# Patient Record
Sex: Female | Born: 1970 | Race: White | Hispanic: No | Marital: Married | Smoking: Never smoker
Health system: Southern US, Community
[De-identification: ages and names within clinical notes are randomized; demographics above are authoritative.]

## PROBLEM LIST (undated history)

## (undated) DIAGNOSIS — E039 Hypothyroidism, unspecified: Secondary | ICD-10-CM

## (undated) DIAGNOSIS — C50919 Malignant neoplasm of unspecified site of unspecified female breast: Secondary | ICD-10-CM

## (undated) HISTORY — PX: MOHS SURGERY: SUR867

## (undated) HISTORY — PX: REDUCTION MAMMAPLASTY: SUR839

---
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
## 1999-11-26 ENCOUNTER — Other Ambulatory Visit: Admission: RE | Admit: 1999-11-26 | Discharge: 1999-11-26 | Payer: Self-pay | Admitting: Emergency Medicine

## 2000-10-04 ENCOUNTER — Encounter: Payer: Self-pay | Admitting: Obstetrics & Gynecology

## 2000-10-04 ENCOUNTER — Ambulatory Visit (HOSPITAL_COMMUNITY): Admission: RE | Admit: 2000-10-04 | Discharge: 2000-10-04 | Payer: Self-pay | Admitting: Obstetrics & Gynecology

## 2000-10-06 ENCOUNTER — Inpatient Hospital Stay (HOSPITAL_COMMUNITY): Admission: AD | Admit: 2000-10-06 | Discharge: 2000-10-09 | Payer: Self-pay | Admitting: Obstetrics & Gynecology

## 2001-02-14 ENCOUNTER — Other Ambulatory Visit: Admission: RE | Admit: 2001-02-14 | Discharge: 2001-02-14 | Payer: Self-pay | Admitting: Obstetrics & Gynecology

## 2002-04-30 ENCOUNTER — Other Ambulatory Visit: Admission: RE | Admit: 2002-04-30 | Discharge: 2002-04-30 | Payer: Self-pay | Admitting: Obstetrics & Gynecology

## 2003-08-08 ENCOUNTER — Other Ambulatory Visit: Admission: RE | Admit: 2003-08-08 | Discharge: 2003-08-08 | Payer: Self-pay | Admitting: Obstetrics & Gynecology

## 2004-08-25 ENCOUNTER — Other Ambulatory Visit: Admission: RE | Admit: 2004-08-25 | Discharge: 2004-08-25 | Payer: Self-pay | Admitting: Obstetrics & Gynecology

## 2004-08-30 ENCOUNTER — Encounter: Admission: RE | Admit: 2004-08-30 | Discharge: 2004-08-30 | Payer: Self-pay | Admitting: Obstetrics & Gynecology

## 2011-05-18 ENCOUNTER — Emergency Department (HOSPITAL_BASED_OUTPATIENT_CLINIC_OR_DEPARTMENT_OTHER)
Admission: EM | Admit: 2011-05-18 | Discharge: 2011-05-18 | Disposition: A | Discharge status: Home / Self Care | Payer: No Typology Code available for payment source | Attending: Emergency Medicine | Admitting: Emergency Medicine

## 2011-05-18 ENCOUNTER — Emergency Department (INDEPENDENT_AMBULATORY_CARE_PROVIDER_SITE_OTHER): Payer: No Typology Code available for payment source

## 2011-05-18 ENCOUNTER — Encounter: Payer: Self-pay | Admitting: Student

## 2011-05-18 DIAGNOSIS — R1032 Left lower quadrant pain: Secondary | ICD-10-CM | POA: Insufficient documentation

## 2011-05-18 DIAGNOSIS — M542 Cervicalgia: Secondary | ICD-10-CM | POA: Insufficient documentation

## 2011-05-18 DIAGNOSIS — R110 Nausea: Secondary | ICD-10-CM | POA: Insufficient documentation

## 2011-05-18 DIAGNOSIS — R0789 Other chest pain: Secondary | ICD-10-CM

## 2011-05-18 DIAGNOSIS — Y92410 Unspecified street and highway as the place of occurrence of the external cause: Secondary | ICD-10-CM | POA: Insufficient documentation

## 2011-05-18 DIAGNOSIS — E039 Hypothyroidism, unspecified: Secondary | ICD-10-CM | POA: Insufficient documentation

## 2011-05-18 HISTORY — DX: Hypothyroidism, unspecified: E03.9

## 2011-05-18 MED ORDER — IBUPROFEN 600 MG PO TABS: 600.0000 mg | ORAL_TABLET | Freq: Four times a day (QID) | ORAL | Status: AC | PRN

## 2011-05-18 MED ORDER — HYDROCODONE-ACETAMINOPHEN 5-500 MG PO TABS: 1.0000 | ORAL_TABLET | Freq: Four times a day (QID) | ORAL | Status: AC | PRN

## 2011-05-18 MED ORDER — ONDANSETRON 4 MG PO TBDP
4.0000 mg | ORAL_TABLET | Freq: Once | ORAL | Status: AC
  Administered 2011-05-18: 4 mg via ORAL
  Filled 2011-05-18: qty 1

## 2011-05-18 NOTE — ED Notes (Signed)
MD at bedside to clear patient from [facility]
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
## 2015-09-19 ENCOUNTER — Emergency Department (HOSPITAL_COMMUNITY)
Admission: EM | Admit: 2015-09-19 | Discharge: 2015-09-19 | Disposition: A | Discharge status: Home / Self Care | Payer: BLUE CROSS/BLUE SHIELD | Attending: Emergency Medicine | Admitting: Emergency Medicine

## 2015-09-19 ENCOUNTER — Encounter (HOSPITAL_COMMUNITY): Payer: Self-pay | Admitting: Emergency Medicine

## 2015-09-19 DIAGNOSIS — Y9389 Activity, other specified: Secondary | ICD-10-CM | POA: Diagnosis not present

## 2015-09-19 DIAGNOSIS — S61213A Laceration without foreign body of left middle finger without damage to nail, initial encounter: Secondary | ICD-10-CM | POA: Diagnosis not present

## 2015-09-19 DIAGNOSIS — Y9289 Other specified places as the place of occurrence of the external cause: Secondary | ICD-10-CM | POA: Diagnosis not present

## 2015-09-19 DIAGNOSIS — W271XXA Contact with garden tool, initial encounter: Secondary | ICD-10-CM | POA: Insufficient documentation

## 2015-09-19 DIAGNOSIS — Z79899 Other long term (current) drug therapy: Secondary | ICD-10-CM | POA: Diagnosis not present

## 2015-09-19 DIAGNOSIS — Y998 Other external cause status: Secondary | ICD-10-CM | POA: Insufficient documentation

## 2015-09-19 DIAGNOSIS — S6992XA Unspecified injury of left wrist, hand and finger(s), initial encounter: Secondary | ICD-10-CM | POA: Diagnosis present

## 2015-09-19 DIAGNOSIS — E039 Hypothyroidism, unspecified: Secondary | ICD-10-CM | POA: Diagnosis not present

## 2015-09-19 MED ORDER — ACETAMINOPHEN-CODEINE #3 300-30 MG PO TABS: 1.0000 | ORAL_TABLET | Freq: Four times a day (QID) | ORAL | Status: DC | PRN

## 2015-09-19 MED ORDER — LIDOCAINE HCL 2 % IJ SOLN
10.0000 mL | Freq: Once | INTRAMUSCULAR | Status: AC
  Administered 2015-09-19: 200 mg via INTRADERMAL
  Filled 2015-09-19: qty 20

## 2015-09-19 NOTE — Discharge Instructions (Signed)
Please have your sutures removed in 5-7 days.  Take tylenol #3 as needed for pain.  Return to the ER if you notice signs of infection.    Laceration Care, Adult A laceration is a cut that goes through all of the layers of the skin and into the tissue that is right under the skin. Some lacerations heal on their own. Others need to be closed with stitches (sutures), staples, skin adhesive strips, or skin glue. Proper laceration care minimizes the risk of infection and helps the laceration to heal better. HOW TO CARE FOR YOUR LACERATION If sutures or staples were used:  Keep the wound clean and dry.  If you were given a bandage (dressing), you should change it at least one time per day or as told by your health care provider. You should also change it if it becomes wet or dirty.  Keep the wound completely dry for the first 24 hours or as told by your health care provider. After that time, you may shower or bathe. However, make sure that the wound is not soaked in water until after the sutures or staples have been removed.  Clean the wound one time each day or as told by your health care provider:  Wash the wound with soap and water.  Rinse the wound with water to remove all soap.  Pat the wound dry with a clean towel. Do not rub the wound.  After cleaning the wound, apply a thin layer of antibiotic ointmentas told by your health care provider. This will help to prevent infection and keep the dressing from sticking to the wound.  Have the sutures or staples removed as told by your health care provider. If skin adhesive strips were used:  Keep the wound clean and dry.  If you were given a bandage (dressing), you should change it at least one time per day or as told by your health care provider. You should also change it if it becomes dirty or wet.  Do not get the skin adhesive strips wet. You may shower or bathe, but be careful to keep the wound dry.  If the wound gets wet, pat it dry  with a clean towel. Do not rub the wound.  Skin adhesive strips fall off on their own. You may trim the strips as the wound heals. Do not remove skin adhesive strips that are still stuck to the wound. They will fall off in time. If skin glue was used:  Try to keep the wound dry, but you may briefly wet it in the shower or bath. Do not soak the wound in water, such as by swimming.  After you have showered or bathed, gently pat the wound dry with a clean towel. Do not rub the wound.  Do not do any activities that will make you sweat heavily until the skin glue has fallen off on its own.  Do not apply liquid, cream, or ointment medicine to the wound while the skin glue is in place. Using those may loosen the film before the wound has healed.  If you were given a bandage (dressing), you should change it at least one time per day or as told by your health care provider. You should also change it if it becomes dirty or wet.  If a dressing is placed over the wound, be careful not to apply tape directly over the skin glue. Doing that may cause the glue to be pulled off before the wound has healed.  Do not pick at the glue. The skin glue usually remains in place for 5-10 days, then it falls off of the skin. General Instructions  Take over-the-counter and prescription medicines only as told by your health care provider.  If you were prescribed an antibiotic medicine or ointment, take or apply it as told by your doctor. Do not stop using it even if your condition improves.  To help prevent scarring, make sure to cover your wound with sunscreen whenever you are outside after stitches are removed, after adhesive strips are removed, or when glue remains in place and the wound is healed. Make sure to wear a sunscreen of at least 30 SPF.  Do not scratch or pick at the wound.  Keep all follow-up visits as told by your health care provider. This is important.  Check your wound every day for signs of  infection. Watch for:  Redness, swelling, or pain.  Fluid, blood, or pus.  Raise (elevate) the injured area above the level of your heart while you are sitting or lying down, if possible. SEEK MEDICAL CARE IF:  You received a tetanus shot and you have swelling, severe pain, redness, or bleeding at the injection site.  You have a fever.  A wound that was closed breaks open.  You notice a bad smell coming from your wound or your dressing.  You notice something coming out of the wound, such as wood or glass.  Your pain is not controlled with medicine.  You have increased redness, swelling, or pain at the site of your wound.  You have fluid, blood, or pus coming from your wound.  You notice a change in the color of your skin near your wound.  You need to change the dressing frequently due to fluid, blood, or pus draining from the wound.  You develop a new rash.  You develop numbness around the wound. SEEK IMMEDIATE MEDICAL CARE IF:  You develop severe swelling around the wound.  Your pain suddenly increases and is severe.  You develop painful lumps near the wound or on skin that is anywhere on your body.  You have a red streak going away from your wound.  The wound is on your hand or foot and you cannot properly move a finger or toe.  The wound is on your hand or foot and you notice that your fingers or toes look pale or bluish.   This information is not intended to replace advice given to you by your health care provider. Make sure you discuss any questions you have with your health care provider.   Document Released: 06/06/2005 Document Revised: 10/21/2014 Document Reviewed: 06/02/2014 Elsevier Interactive Patient Education [employer].

## 2015-09-19 NOTE — ED Provider Notes (Signed)
CSN: [place]:[id number]     Arrival date & time 09/19/15  1402 History   First MD Initiated Contact with Patient 09/19/15 1416     Chief Complaint  Patient presents with  . Laceration     (Consider location/radiation/quality/duration/timing/severity/associated sxs/prior Treatment) HPI   45 year old female who presents for evaluation of finger laceration. Patient reported approximately 30 minutes ago she was using a hedge clipper while trimming branches when she forgetfully reaching her L hand across to remove a branch and accidentally injured her left middle finger. She suffered to small lacerations to the pad of her L middle finger.  She report 4/10 non radiating throbbing pain to the pad of finger without any numbness. She did tried rinsing it under the water for several minutes and came here.  She is UTD with tetanus.  She is R hand dominant.  She denies any other injury.    Past Medical History  Diagnosis Date  . Hypothyroidism    Past Surgical History  Procedure Laterality Date  . Cesarean section     No family history on file. Social History  Substance Use Topics  . Smoking status: Never Smoker   . Smokeless tobacco: Not on file  . Alcohol Use: Yes   OB History    No data available     Review of Systems  Constitutional: Negative for fever.  Skin: Positive for wound.  Neurological: Negative for numbness.      Allergies  Phenobarbital  Home Medications   Prior to Admission medications   Medication Sig Start Date End Date Taking? Authorizing Provider  levothyroxine (SYNTHROID, LEVOTHROID) 75 MCG tablet Take 75 mcg by mouth daily.      Historical Provider, MD   There were no vitals taken for this visit. Physical Exam  Constitutional: She appears well-developed and well-nourished. No distress.  HENT:  Head: Atraumatic.  Eyes: Conjunctivae are normal.  Neck: Neck supple.  Musculoskeletal: She exhibits tenderness (L hand, middle finger: 1cm oblique superficial lac to  pad of finger. 56mm superficial lac medial to the other lac on the pad of finger.  brisk cap  refill.  no nail involvement.  no joint involvement).  Neurological: She is alert.  Skin: No rash noted.  Psychiatric: She has a normal mood and affect.  Nursing note and vitals reviewed.   ED Course  Procedures (including critical care time)   MDM   Final diagnoses:  Laceration of left middle finger w/o foreign body w/o damage to nail, initial encounter    BP 108/74 mmHg  Pulse 94  Temp(Src) 98 F (36.7 C) (Oral)  Ht 5\' 3"  (1.6 m)  Wt 58.968 kg  BMI 23.03 kg/m2  SpO2 100%   2:26 PM Pt accidentally injured her L middle finger on a hedge clipper PTA.  Will perform wound repair.    LACERATION REPAIR Performed by: Domenic Moras Authorized byDomenic Moras Consent: Verbal consent obtained. Risks and benefits: risks, benefits and alternatives were discussed Consent given by: patient Patient identity confirmed: provided demographic data Prepped and Draped in normal sterile fashion Wound explored  Laceration Location: L middle finger, pad  Laceration Length: 1cm  No Foreign Bodies seen or palpated  Anesthesia: digital nerve block  Local anesthetic: lidocaine 2% w/o epinephrine  Anesthetic total: 4 ml  Irrigation method: syringe Amount of cleaning: standard  Skin closure: prolene 5.0  Number of sutures: 4  Technique: simple interrupted  Patient tolerance: Patient tolerated the procedure well with no immediate complications.   Domenic Moras, PA-C  09/19/15 [place], MD 09/20/15 [id number]

## 2015-09-19 NOTE — ED Notes (Signed)
Pt states she cut her left third finger on hedge clippers about 30 mins ago

## 2015-11-04 ENCOUNTER — Other Ambulatory Visit: Payer: Self-pay | Admitting: Obstetrics & Gynecology

## 2015-11-04 DIAGNOSIS — R928 Other abnormal and inconclusive findings on diagnostic imaging of breast: Secondary | ICD-10-CM

## 2015-11-10 ENCOUNTER — Ambulatory Visit
Admission: RE | Admit: 2015-11-10 | Discharge: 2015-11-10 | Disposition: A | Discharge status: Home / Self Care | Payer: BLUE CROSS/BLUE SHIELD | Source: Ambulatory Visit | Attending: Obstetrics & Gynecology | Admitting: Obstetrics & Gynecology

## 2015-11-10 DIAGNOSIS — R928 Other abnormal and inconclusive findings on diagnostic imaging of breast: Secondary | ICD-10-CM

## 2019-06-21 DIAGNOSIS — C50919 Malignant neoplasm of unspecified site of unspecified female breast: Secondary | ICD-10-CM

## 2019-06-21 HISTORY — DX: Malignant neoplasm of unspecified site of unspecified female breast: C50.919

## 2020-01-23 ENCOUNTER — Other Ambulatory Visit: Payer: Self-pay | Admitting: Obstetrics & Gynecology

## 2020-01-23 DIAGNOSIS — R928 Other abnormal and inconclusive findings on diagnostic imaging of breast: Secondary | ICD-10-CM

## 2020-02-03 ENCOUNTER — Other Ambulatory Visit: Payer: Self-pay | Admitting: Obstetrics & Gynecology

## 2020-02-03 ENCOUNTER — Other Ambulatory Visit: Payer: Self-pay

## 2020-02-03 ENCOUNTER — Ambulatory Visit
Admission: RE | Admit: 2020-02-03 | Discharge: 2020-02-03 | Disposition: A | Discharge status: Home / Self Care | Payer: BLUE CROSS/BLUE SHIELD | Source: Ambulatory Visit | Attending: Obstetrics & Gynecology | Admitting: Obstetrics & Gynecology

## 2020-02-03 DIAGNOSIS — R928 Other abnormal and inconclusive findings on diagnostic imaging of breast: Secondary | ICD-10-CM

## 2020-02-03 DIAGNOSIS — R921 Mammographic calcification found on diagnostic imaging of breast: Secondary | ICD-10-CM

## 2020-02-11 ENCOUNTER — Ambulatory Visit
Admission: RE | Admit: 2020-02-11 | Discharge: 2020-02-11 | Disposition: A | Discharge status: Home / Self Care | Payer: BC Managed Care – PPO | Source: Ambulatory Visit | Attending: Obstetrics & Gynecology | Admitting: Obstetrics & Gynecology

## 2020-02-11 ENCOUNTER — Other Ambulatory Visit: Payer: Self-pay

## 2020-02-11 DIAGNOSIS — R921 Mammographic calcification found on diagnostic imaging of breast: Secondary | ICD-10-CM

## 2020-02-14 ENCOUNTER — Encounter: Payer: Self-pay | Admitting: *Deleted

## 2020-02-14 DIAGNOSIS — D0511 Intraductal carcinoma in situ of right breast: Secondary | ICD-10-CM | POA: Insufficient documentation

## 2020-02-18 ENCOUNTER — Other Ambulatory Visit: Payer: BC Managed Care – PPO | Admitting: Licensed Clinical Social Worker

## 2020-02-18 NOTE — Progress Notes (Signed)
[facility] NOTE  Patient Care Team: [name], Shyrl Numbers, MD as PCP - General (Family Medicine) Rockwell Germany, RN as Oncology Nurse Navigator Mauro Kaufmann, RN as Oncology Nurse Navigator Alphonsa Overall, MD as Consulting Physician (General Surgery) Nicholas Lose, MD as Consulting Physician (Hematology and Oncology)  CHIEF COMPLAINTS/PURPOSE OF CONSULTATION:  Newly diagnosed breast cancer  HISTORY OF PRESENTING ILLNESS:  Meredith [name] 49 y.o. 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 Screening mammogram detected right breast calcifications. Diagnostic mammogram on 02/03/20 showed the calcifications spanning 1.4cm. Biopsy on 02/11/20 showed ductal carcinoma in situ, high grade, ER+ 95%, PR+ 30%. She presents to the clinic today for initial evaluation and discussion of treatment options.   I reviewed her records extensively and collaborated the history with the patient.  SUMMARY OF ONCOLOGIC HISTORY: Oncology History  Ductal carcinoma in situ (DCIS) of right breast  02/14/2020 Initial Diagnosis   Screening mammogram detected right breast calcifications spanning 1.4cm. Biopsy showed DCIS, high grade, ER+ 95%, PR+ 30%.     MEDICAL HISTORY:  Past Medical History:  Diagnosis Date  . Breast cancer ([place])   . Hypothyroidism     SURGICAL HISTORY: Past Surgical History:  Procedure Laterality Date  . CESAREAN SECTION    . MOHS SURGERY     basal cell    SOCIAL HISTORY: Social History   Socioeconomic History  . Marital status: Married    Spouse name: Not on file  . Number of children: Not on file  . Years of education: Not on file  . Highest education level: Not on file  Occupational History  . Not on file  Tobacco Use  . Smoking status: Never Smoker  . Smokeless tobacco: Never Used  Substance and Sexual Activity  . Alcohol use: Yes  . Drug use: Never  . Sexual activity: Not on file  Other Topics  Concern  . Not on file  Social History Narrative  . Not on file   Social Determinants of Health   Financial Resource Strain: Low Risk   . Difficulty of Paying Living Expenses: Not hard at all  Food Insecurity: No Food Insecurity  . Worried About Charity fundraiser in the Last Year: Never true  . Ran Out of Food in the Last Year: Never true  Transportation Needs: No Transportation Needs  . Lack of Transportation (Medical): No  . Lack of Transportation (Non-Medical): No  Physical Activity:   . Days of Exercise per Week: Not on file  . Minutes of Exercise per Session: Not on file  Stress:   . Feeling of Stress : Not on file  Social Connections:   . Frequency of Communication with Friends and Family: Not on file  . Frequency of Social Gatherings with Friends and Family: Not on file  . Attends Religious Services: Not on file  . Active Member of Clubs or Organizations: Not on file  . Attends Archivist Meetings: Not on file  . Marital Status: Not on file  Intimate Partner Violence:   . Fear of Current or Ex-Partner: Not on file  . Emotionally Abused: Not on file  . Physically Abused: Not on file  . Sexually Abused: Not on file    FAMILY HISTORY: Family History  Problem Relation Age of Onset  . Prostate cancer Maternal Grandfather   . Skin cancer Maternal Grandfather     ALLERGIES:  is allergic to demerol [meperidine] and  phenobarbital.  MEDICATIONS:  Current Outpatient Medications  Medication Sig Dispense Refill  . acetaminophen-codeine (TYLENOL #3) 300-30 MG tablet Take 1-2 tablets by mouth every 6 (six) hours as needed for moderate pain. 15 tablet 0  . cetirizine (ZYRTEC) 10 MG tablet Take by mouth.    . levonorgestrel (MIRENA, 52 MG,) 20 MCG/24HR IUD Mirena 20 mcg/24 hours (6 yrs) 52 mg intrauterine device    . levothyroxine (SYNTHROID) 100 MCG tablet Take 100 mcg by mouth daily before breakfast.     No current facility-administered medications for this  visit.    REVIEW OF SYSTEMS:     All other systems were reviewed with the patient and are negative.  PHYSICAL EXAMINATION: ECOG PERFORMANCE STATUS: 1 - Symptomatic but completely ambulatory  Vitals:   02/19/20 0844  BP: 120/86  Pulse: 82  Resp: 17  Temp: 98.1 F (36.7 C)  SpO2: 100%   Filed Weights   02/19/20 0844  Weight: 152 lb (68.9 kg)      LABORATORY DATA:  I have reviewed the data as listed Lab Results  Component Value Date   WBC 4.4 02/19/2020   HGB 14.0 02/19/2020   HCT 41.3 02/19/2020   MCV 89.2 02/19/2020   PLT 263 02/19/2020   Lab Results  Component Value Date   NA 140 02/19/2020   K 4.6 02/19/2020   CL 105 02/19/2020   CO2 28 02/19/2020    RADIOGRAPHIC STUDIES: I have personally reviewed the radiological reports and agreed with the findings in the report.  ASSESSMENT AND PLAN:  Ductal carcinoma in situ (DCIS) of right breast 02/14/2020:Screening mammogram detected right breast calcifications spanning 1.4cm. Biopsy showed DCIS, high grade, ER+ 95%, PR+ 30%.  Tis NX stage 0  Pathology review: I discussed with the patient the difference between DCIS and invasive breast cancer. It is considered a precancerous lesion. DCIS is classified as a 0. It is generally detected through mammograms as calcifications. We discussed the significance of grades and its impact on prognosis. We also discussed the importance of ER and PR receptors and their implications to adjuvant treatment options. Prognosis of DCIS dependence on grade, comedo necrosis. It is anticipated that if not treated, 20-30% of DCIS can develop into invasive breast cancer.  Recommendation: 1. Breast conserving surgery 2. Followed by adjuvant radiation therapy 3. Followed by antiestrogen therapy with tamoxifen 5 years versus anastrozole (we discussed both of these options and she will decide when she is ready to start antiestrogen therapy)  Tamoxifen counseling: We discussed the risks and benefits  of tamoxifen. These include but not limited to insomnia, hot flashes, mood changes, vaginal dryness, and weight gain. Although rare, serious side effects including endometrial cancer, risk of blood clots were also discussed. We strongly believe that the benefits far outweigh the risks. Patient understands these risks and consented to starting treatment. Planned treatment duration is 5 years.  Return to clinic after surgery to discuss the final pathology report and come up with an adjuvant treatment plan.     All questions were answered. The patient knows to call the clinic with any problems, questions or concerns.   Rulon Eisenmenger, MD, MPH 02/19/2020    I, Molly Dorshimer, am acting as scribe for Nicholas Lose, MD.  I have reviewed the above documentation for accuracy and completeness, and I agree with the above.

## 2020-02-19 ENCOUNTER — Inpatient Hospital Stay: Payer: BC Managed Care – PPO | Attending: Hematology and Oncology | Admitting: Hematology and Oncology

## 2020-02-19 ENCOUNTER — Ambulatory Visit
Admission: RE | Admit: 2020-02-19 | Discharge: 2020-02-19 | Disposition: A | Discharge status: Home / Self Care | Payer: BC Managed Care – PPO | Source: Ambulatory Visit | Attending: Radiation Oncology | Admitting: Radiation Oncology

## 2020-02-19 ENCOUNTER — Other Ambulatory Visit: Payer: Self-pay | Admitting: *Deleted

## 2020-02-19 ENCOUNTER — Encounter: Payer: Self-pay | Admitting: *Deleted

## 2020-02-19 ENCOUNTER — Inpatient Hospital Stay (HOSPITAL_BASED_OUTPATIENT_CLINIC_OR_DEPARTMENT_OTHER): Payer: BC Managed Care – PPO | Admitting: Internal Medicine

## 2020-02-19 ENCOUNTER — Inpatient Hospital Stay: Payer: BC Managed Care – PPO | Admitting: Licensed Clinical Social Worker

## 2020-02-19 ENCOUNTER — Other Ambulatory Visit: Payer: Self-pay

## 2020-02-19 ENCOUNTER — Encounter: Payer: Self-pay | Admitting: Hematology and Oncology

## 2020-02-19 ENCOUNTER — Ambulatory Visit (HOSPITAL_BASED_OUTPATIENT_CLINIC_OR_DEPARTMENT_OTHER): Payer: BC Managed Care – PPO | Admitting: Genetic Counselor

## 2020-02-19 ENCOUNTER — Ambulatory Visit: Payer: BC Managed Care – PPO | Admitting: Physical Therapy

## 2020-02-19 DIAGNOSIS — E039 Hypothyroidism, unspecified: Secondary | ICD-10-CM | POA: Diagnosis not present

## 2020-02-19 DIAGNOSIS — D0511 Intraductal carcinoma in situ of right breast: Secondary | ICD-10-CM

## 2020-02-19 DIAGNOSIS — Z170 Estrogen receptor positive status [ER+]: Secondary | ICD-10-CM | POA: Diagnosis not present

## 2020-02-19 DIAGNOSIS — Z8042 Family history of malignant neoplasm of prostate: Secondary | ICD-10-CM

## 2020-02-19 LAB — CMP (CANCER CENTER ONLY)
ALT: 16 U/L (ref 0–44)
AST: 22 U/L (ref 15–41)
Albumin: 4.4 g/dL (ref 3.5–5.0)
Alkaline Phosphatase: 74 U/L (ref 38–126)
Anion gap: 7 (ref 5–15)
BUN: 14 mg/dL (ref 6–20)
CO2: 28 mmol/L (ref 22–32)
Calcium: 10.5 mg/dL — ABNORMAL HIGH (ref 8.9–10.3)
Chloride: 105 mmol/L (ref 98–111)
Creatinine: 0.83 mg/dL (ref 0.44–1.00)
GFR, Est AFR Am: >60 mL/min (ref >60)
GFR, Estimated: >60 mL/min (ref >60)
Glucose, Bld: 87 mg/dL (ref 70–99)
Potassium: 4.6 mmol/L (ref 3.5–5.1)
Sodium: 140 mmol/L (ref 135–145)
Total Bilirubin: 0.7 mg/dL (ref 0.3–1.2)
Total Protein: 7.5 g/dL (ref 6.5–8.1)

## 2020-02-19 LAB — CBC WITH DIFFERENTIAL (CANCER CENTER ONLY)
Abs Immature Granulocytes: 0.01 10*3/uL (ref 0.00–0.07)
Basophils Absolute: 0.1 10*3/uL (ref 0.0–0.1)
Basophils Relative: 2 %
Eosinophils Absolute: 0.1 10*3/uL (ref 0.0–0.5)
Eosinophils Relative: 3 %
HCT: 41.3 % (ref 36.0–46.0)
Hemoglobin: 14 g/dL (ref 12.0–15.0)
Immature Granulocytes: 0 %
Lymphocytes Relative: 33 %
Lymphs Abs: 1.5 10*3/uL (ref 0.7–4.0)
MCH: 30.2 pg (ref 26.0–34.0)
MCHC: 33.9 g/dL (ref 30.0–36.0)
MCV: 89.2 fL (ref 80.0–100.0)
Monocytes Absolute: 0.5 10*3/uL (ref 0.1–1.0)
Monocytes Relative: 12 %
Neutro Abs: 2.2 10*3/uL (ref 1.7–7.7)
Neutrophils Relative %: 50 %
Platelet Count: 263 10*3/uL (ref 150–400)
RBC: 4.63 MIL/uL (ref 3.87–5.11)
RDW: 11.5 % (ref 11.5–15.5)
WBC Count: 4.4 10*3/uL (ref 4.0–10.5)
nRBC: 0 % (ref 0.0–0.2)

## 2020-02-19 LAB — GENETIC SCREENING ORDER

## 2020-02-19 NOTE — Assessment & Plan Note (Signed)
02/14/2020:Screening mammogram detected right breast calcifications spanning 1.4cm. Biopsy showed DCIS, high grade, ER+ 95%, PR+ 30%.  Tis NX stage 0  Pathology review: I discussed with the patient the difference between DCIS and invasive breast cancer. It is considered a precancerous lesion. DCIS is classified as a 0. It is generally detected through mammograms as calcifications. We discussed the significance of grades and its impact on prognosis. We also discussed the importance of ER and PR receptors and their implications to adjuvant treatment options. Prognosis of DCIS dependence on grade, comedo necrosis. It is anticipated that if not treated, 20-30% of DCIS can develop into invasive breast cancer.  Recommendation: 1. Breast conserving surgery 2. Followed by adjuvant radiation therapy 3. Followed by antiestrogen therapy with tamoxifen 5 years versus anastrozole (we discussed both of these options and she will decide when she is ready to start antiestrogen therapy)  Tamoxifen counseling: We discussed the risks and benefits of tamoxifen. These include but not limited to insomnia, hot flashes, mood changes, vaginal dryness, and weight gain. Although rare, serious side effects including endometrial cancer, risk of blood clots were also discussed. We strongly believe that the benefits far outweigh the risks. Patient understands these risks and consented to starting treatment. Planned treatment duration is 5 years.  Return to clinic after surgery to discuss the final pathology report and come up with an adjuvant treatment plan.

## 2020-02-19 NOTE — Progress Notes (Signed)
[place] [facility] INITIAL SDOH Screening Note   SEQUITA WISE is a 49 y.o. year old female accompanied by husband, [name], during [facility].    Ms. Darrington was given information about support services today including CSW contact information, information about support team members and programs.   SDOH (Social Determinants of Health) assessments performed: Yes- no needs identified    Family/Social Information:  . Housing Arrangement: patient lives with husband, [name] . Family members/support persons in your life? Husband, two sons (ages 31 & 32), daughter-in-law, mom, other family, friends . Transportation: no concerns . Financial concerns: No  . Employment: Working full time as Cabin crew Income source: Employment and husband's employment . Food Security: no concerns . Medication Concerns: no (if patient is experiencing medication concerns, please refer to pharmacy) . Services Currently in place:  n/a  . Concerns about diagnosis and/or treatment: some stress about if MRI will pick up anything else, but overall feels good about treatment plan and thankful it was caught early . Patient reported stressors: Adjusting to my illness . Patient enjoys time with friends & family (sons, baby grandchild), being active, her job Current coping skills/ strengths: Average or above average intelligence Capable of independent living Occupational psychologist fund of knowledge Motivation for treatment/growth Supportive family/friends     SUMMARY: Current SDOH Barriers:  . none identified today  Clinical Social Work Clinical Goal(s):  Marland Kitchen Over the next 30 days, patient will attend all scheduled medical appointments:    Interventions: . Patient interviewed and SDOH assessment performed . Provided patient with information about support programs and services   Follow Up Plan: Client will continue to utilize support system and attend all scheduled appointments Patient  verbalizes understanding of plan: Yes   Edwinna Areola Toneka Fullen LCSW

## 2020-02-19 NOTE — Progress Notes (Signed)
Radiation Oncology         [phone] ________________________________  Name: Meredith [name]        MRN: [id number]  Date of Service: 02/19/2020 DOB: [birth year]  OX:[id number], Shyrl Numbers, MD  [name], Shyrl Numbers, *     REFERRING PHYSICIAN: Wayland Salinas, *   DIAGNOSIS: The encounter diagnosis was Ductal carcinoma in situ (DCIS) of right breast.   HISTORY OF PRESENT ILLNESS: Meredith [name] is a 49 y.o. female seen in the multidisciplinary breast clinic for a new diagnosis of right breast cancer. The patient was noted to have screening detected calcifications in the right breast. Further diagnostic imaging measured this area as 1.4 cm in greatest dimension. Her axilla was not evaluated by ultrasound and a stereotactic biopsy on 02/11/20 revealed a high grade DCIS with necrosis. Her cancer was ER/PR positive. She's seen today to discuss treatment recommendations for her cancer.    PREVIOUS RADIATION THERAPY: No   PAST MEDICAL HISTORY:  Past Medical History:  Diagnosis Date  . Hypothyroidism        PAST SURGICAL HISTORY: Past Surgical History:  Procedure Laterality Date  . CESAREAN SECTION       FAMILY HISTORY: No family history on file.   SOCIAL HISTORY:  reports that she has never smoked. She does not have any smokeless tobacco history on file. She reports current alcohol use. The patient is married and lives in [place]. She is a Forensic psychologist. She has two children, one is a NP in family medicine, and the other is a Electronics engineer.   ALLERGIES: Phenobarbital   MEDICATIONS:  Current Outpatient Medications  Medication Sig Dispense Refill  . acetaminophen-codeine (TYLENOL #3) 300-30 MG tablet Take 1-2 tablets by mouth every 6 (six) hours as needed for moderate pain. 15 tablet 0  . levothyroxine (SYNTHROID, LEVOTHROID) 75 MCG tablet Take 75 mcg by mouth daily.       No current facility-administered medications for this encounter.     REVIEW OF SYSTEMS:  On review of systems, the patient reports that she is doing well overall. She has had soreness since her biopsy but no bruising or bleeding. No other complaints are specifically noted.    PHYSICAL EXAM:  Wt Readings from Last 3 Encounters:  09/19/15 130 lb (59 kg)   Temp Readings from Last 3 Encounters:  09/19/15 98 F (36.7 C) (Oral)  05/18/11 97.7 F (36.5 C) (Oral)   BP Readings from Last 3 Encounters:  09/19/15 108/74  05/18/11 112/76   Pulse Readings from Last 3 Encounters:  09/19/15 94  05/18/11 75    In general this is a well appearing caucasian female in no acute distress. She's alert and oriented x4 and appropriate throughout the examination. Cardiopulmonary assessment is negative for acute distress and she exhibits normal effort. Bilateral breast exam is deferred.    ECOG = 1  0 - Asymptomatic (Fully active, able to carry on all predisease activities without restriction)  1 - Symptomatic but completely ambulatory (Restricted in physically strenuous activity but ambulatory and able to carry out work of a light or sedentary nature. For example, light housework, office work)  2 - Symptomatic, <50% in bed during the day (Ambulatory and capable of all self care but unable to carry out any work activities. Up and about more than 50% of waking hours)  3 - Symptomatic, >50% in bed, but not bedbound (Capable of only limited self-care, confined to bed or chair 50% or more  of waking hours)  4 - Bedbound (Completely disabled. Cannot carry on any self-care. Totally confined to bed or chair)  5 - Death   Eustace Pen MM, Creech RH, Tormey DC, et al. [phone]). "Toxicity and response criteria of the [facility]". [place] Oncol. 5 (6): 649-55    LABORATORY DATA:  No results found for: WBC, HGB, HCT, MCV, PLT No results found for: NA, K, CL, CO2 No results found for: ALT, AST, GGT, ALKPHOS, BILITOT    RADIOGRAPHY: MM Digital Diagnostic Unilat R  Result  Date: 02/03/2020 CLINICAL DATA:  The patient was called back for right breast calcifications EXAM: DIGITAL DIAGNOSTIC RIGHT MAMMOGRAM COMPARISON:  Previous exam(s). ACR Breast Density Category c: The breast tissue is heterogeneously dense, which may obscure small masses. FINDINGS: There is a new group of calcifications in the medial inferior right breast spanning 1.4 cm. These calcifications are indeterminate and do not layer. IMPRESSION: Indeterminate right breast calcifications. RECOMMENDATION: Recommend stereotactic biopsy of the right breast calcifications. I have discussed the findings and recommendations with the patient. If applicable, a reminder letter will be sent to the patient regarding the next appointment. BI-RADS CATEGORY  4: Suspicious. Electronically Signed   By: Dorise Bullion III M.D   On: 02/03/2020 14:31   MM CLIP PLACEMENT RIGHT  Result Date: 02/11/2020 CLINICAL DATA:  49 year old female status post stereotactic biopsy of the right breast. EXAM: DIAGNOSTIC RIGHT MAMMOGRAM POST STEREOTACTIC BIOPSY COMPARISON:  Previous exam(s). FINDINGS: Mammographic images were obtained following stereotactic guided biopsy of the right breast. The biopsy marking clip is in expected position at the site of biopsy. IMPRESSION: Appropriate positioning of the coil shaped biopsy marking clip at the site of biopsy in the lower inner quadrant of the right breast. Final Assessment: Post Procedure Mammograms for Marker Placement Electronically Signed   By: Kristopher Oppenheim M.D.   On: 02/11/2020 09:24   MM RT BREAST BX W LOC DEV 1ST LESION IMAGE BX SPEC STEREO GUIDE  Addendum Date: 02/12/2020   ADDENDUM REPORT: 02/12/2020 13:26 ADDENDUM: Pathology revealed HIGH GRADE DUCTAL CARCINOMA IN SITU of the Right breast, lower inner quadrant. This was found to be concordant by Dr. Kristopher Oppenheim. Pathology results were discussed with the patient by telephone. The patient reported doing well after the biopsy with tenderness  and swelling at the site. Post biopsy instructions and care were reviewed and questions were answered. The patient was encouraged to call [facility] [place] for any additional concerns. My direct phone number was provided. The patient was referred to [facility] at [facility] on February 19, 2020. Consideration for a bilateral breast MRI for further evaluation of extent of disease given the High Grade histology. Pathology results reported by Terie Purser, RN on 02/12/2020. Electronically Signed   By: Kristopher Oppenheim M.D.   On: 02/12/2020 13:26   Result Date: 02/12/2020 CLINICAL DATA:  49 year old female with indeterminate right breast calcifications. EXAM: RIGHT BREAST STEREOTACTIC CORE NEEDLE BIOPSY COMPARISON:  Previous exams. FINDINGS: The patient and I discussed the procedure of stereotactic-guided biopsy including benefits and alternatives. We discussed the high likelihood of a successful procedure. We discussed the risks of the procedure including infection, bleeding, tissue injury, clip migration, and inadequate sampling. Informed written consent was given. The usual time out protocol was performed immediately prior to the procedure. Using sterile technique and 1% Lidocaine as local anesthetic, under stereotactic guidance, a 9 gauge vacuum assisted device was  used to perform core needle biopsy of calcifications in the lower inner quadrant of the right breast using a medial approach. Specimen radiograph was performed showing calcifications in all 3 specimens. Specimens with calcifications are identified for pathology. As soon as the biopsy began, the patient began feeling nauseous, and therefore only 3 samples were obtained. Lesion quadrant: Lower inner quadrant At the conclusion of the procedure, coil shaped tissue marker clip was deployed into the biopsy cavity. Follow-up 2-view mammogram was performed and dictated separately.  IMPRESSION: Stereotactic-guided biopsy of right breast calcifications. No apparent complications. Electronically Signed: By: Kristopher Oppenheim M.D. On: 02/11/2020 09:23       IMPRESSION/PLAN: 1. High grade, ER/PR positive DCIS of the right breast. Dr. Lisbeth Renshaw discusses the pathology findings and reviews the nature of noninvasive right breast disease. The consensus from the breast conference includes breast conservation with lumpectomy as well as external radiotherapy to the breast followed by antiestrogen therapy. We reviewed the rationale and history of radiotherapy following breast conserving surgery. We discussed the risks, benefits, short, and long term effects of radiotherapy, and the patient is interested in proceeding providing that genetic testing wouldn't influence surgery differently. Dr. Lisbeth Renshaw discusses the delivery and logistics of radiotherapy and anticipates a course of 6 1/2 weeks of radiotherapy. We will see her back a few  weeks after surgery to discuss the simulation process and anticipate we starting radiotherapy about 4-6 weeks after surgery.  2. Possible genetic predisposition to malignancy. The patient is a candidate for genetic testing given her personal and family history. She was offered referral and she is interested in meeting the geneticist today.   In a visit lasting 60 minutes, greater than 50% of the time was spent face to face reviewing her case, as well as in preparation of, discussing, and coordinating the patient's care.  The above documentation reflects my direct findings during this shared patient visit. Please see the separate note by Dr. Lisbeth Renshaw on this date for the remainder of the patient's plan of care.    Carola Rhine, PAC

## 2020-02-20 ENCOUNTER — Encounter: Payer: Self-pay | Admitting: Genetic Counselor

## 2020-02-20 DIAGNOSIS — Z8042 Family history of malignant neoplasm of prostate: Secondary | ICD-10-CM

## 2020-02-20 HISTORY — DX: Family history of malignant neoplasm of prostate: Z80.42

## 2020-02-20 NOTE — Progress Notes (Signed)
REFERRING PROVIDER: Nicholas Lose, MD [place],  [place] [id number]  PRIMARY PROVIDER:  Wayland Salinas, MD  PRIMARY REASON FOR VISIT:  1. Ductal carcinoma in situ (DCIS) of right breast   2. Family history of prostate cancer    I connected with Meredith [name] on 02/19/2020 at 12:30pm EDT by Webex video conference and verified that I am speaking with the correct person using two identifiers.   Patient location: [place] at [facility] Provider location: [facility]:   Meredith [name], a 49 y.o. female, was seen for a [place] cancer genetics consultation during the breast multidisciplinary clinic at the request of Dr. Lindi Adie due to a personal history of DCIS and a family history of prostate cancer and an unknown gynecological cancer.  Meredith [name] presents to clinic today with her husband to discuss the possibility of a hereditary predisposition to cancer, genetic testing, and to further clarify her future cancer risks, as well as potential cancer risks for family members.   In August 2021, at the age of 49, Meredith [name] was diagnosed with ductal carcinoma in situ of the right breast. The preliminary treatment plan includes breast conserving surgery, adjuvant radiation, and antiestrogen therapy.  Meredith [name] also has a history of basal cell carcinoma.   CANCER HISTORY:  Oncology History  Ductal carcinoma in situ (DCIS) of right breast  02/14/2020 Initial Diagnosis   Screening mammogram detected right breast calcifications spanning 1.4cm. Biopsy showed DCIS, high grade, ER+ 95%, PR+ 30%.   02/19/2020 Cancer Staging   Staging form: Breast, AJCC 8th Edition - Clinical stage from 02/19/2020: Stage 0 (cTis (DCIS), cN0, cM0, ER+, PR+) - Signed by Nicholas Lose, MD on 02/19/2020     RISK FACTORS:  Menarche was at age 26.  First live birth at age 44.  OCP use for approximately  23  years.  Ovaries intact: yes.    Hysterectomy: no.  Menopausal status: last period 11 years ago due to IUD.  HRT use: 0 years. Colonoscopy: no; not examined; occult blood test in August 2021 Mammogram within the last year: yes. Up to date with pelvic exams: yes; patient reports most recent PAP in August 2021 Any excessive radiation exposure in the past: no  Past Medical History:  Diagnosis Date   Breast cancer ([place])    Family history of prostate cancer 02/20/2020   Hypothyroidism     Past Surgical History:  Procedure Laterality Date   CESAREAN SECTION     MOHS SURGERY     basal cell    Social History   Socioeconomic History   Marital status: Married    Spouse name: Not on file   Number of children: Not on file   Years of education: Not on file   Highest education level: Not on file  Occupational History   Not on file  Tobacco Use   Smoking status: Never Smoker   Smokeless tobacco: Never Used  Substance and Sexual Activity   Alcohol use: Yes   Drug use: Never   Sexual activity: Not on file  Other Topics Concern   Not on file  Social History Narrative   Not on file   Social Determinants of Health   Financial Resource Strain: Low Risk    Difficulty of Paying Living Expenses: Not hard at all  Food Insecurity: No Food Insecurity   Worried About [place] in the Last  Year: Never true   [place] in the Last Year: Never true  Transportation Needs: No Transportation Needs   Lack of Transportation (Medical): No   Lack of Transportation (Non-Medical): No  Physical Activity:    Days of Exercise per Week: Not on file   Minutes of Exercise per Session: Not on file  Stress:    Feeling of Stress : Not on file  Social Connections:    Frequency of Communication with Friends and Family: Not on file   Frequency of Social Gatherings with Friends and Family: Not on file   Attends Religious Services: Not on file   Active Member of Clubs or Organizations: Not on file   Attends Theatre manager Meetings: Not on file   Marital Status: Not on file     FAMILY HISTORY:  We obtained a detailed, 4-generation family history.  Significant diagnoses are listed below: Family History  Problem Relation Age of Onset   Prostate cancer Maternal Grandfather    Skin cancer Maternal Grandfather    Cancer Maternal Aunt        dx mid 56s; GYN cancer        Meredith [name] has one daughter, age 62, and one son, age 21, both without a history of cancer.  Meredith [name] has one brother and one sister.  No cancer history was reported in her siblings or their children.  Meredith [name] mother is 25 years old without a cancer history.  Meredith [name] maternal aunt was diagnosed with a gynecological cancer in her mid 44s.  She has limited information about her aunt's cancer.  Meredith [name] maternal grandfather was diagnosed with skin cancer in his mid 23s, prostate cancer in his early 19s, and passed away in his mid 50s.  No other maternal family history of cancer was reported.  Meredith [name] father died by suicide at age 20.  No paternal family history of cancer was reported.   Meredith [name] is unaware of previous family history of genetic testing for hereditary cancer risks. Patient's maternal ancestors are of [place] descent, and paternal ancestors are of [place] and [place] descent. There is no reported Ashkenazi Jewish ancestry. There is no known consanguinity.  GENETIC COUNSELING ASSESSMENT: Meredith [name] is a 49 y.o. female with a personal history of DCIS and a family history of prostate and gynecological cancer which is somewhat suggestive of a hereditary breast cancer syndrome and predisposition to cancer given the age of Meredith [name] diagnosis and the presence of related cancers in the family. We, therefore, discussed and recommended the following at today's visit.   DISCUSSION: We discussed that 5 - 10% of cancer is hereditary, with most cases of hereditary breast cancer associated with mutations in BRCA1 and BRCA2.  There are  other genes that can be associated with hereditary cancer syndromes.  Type of cancer risk and level of risk is gene-specific.  We discussed that testing is beneficial for several reasons including knowing how to follow individuals after completing their treatment, identifying whether potential treatment options would be beneficial, and understanding if other family members could be at risk for cancer and allowing them to undergo genetic testing.   We reviewed the characteristics, features and inheritance patterns of hereditary cancer syndromes. We also discussed genetic testing, including the appropriate family members to test, the process of testing, insurance coverage and turn-around-time for results. We discussed the implications of a negative, positive and/or variant of uncertain significant result. In order to get genetic test  results in a timely manner so that Meredith [name] can use these genetic test results for surgical decisions, we recommended Meredith [name] pursue genetic testing for the The STAT Breast cancer panel offered by [facility] includes sequencing and rearrangement analysis for the following 9 genes:  ATM, BRCA1, BRCA2, CDH1, CHEK2, PALB2, PTEN, STK11 and TP53.  Once complete, we recommend Meredith [name] pursue reflex genetic testing to a more comprehensive gene panel.   Meredith [name]  was offered a common hereditary cancer panel (48 genes) and an expanded pan-cancer panel (85 genes). Meredith [name]. Koloski was informed of the benefits and limitations of each panel, including that expanded pan-cancer panels contain several preliminary evidence genes that do not have clear management guidelines at this point in time.  We also discussed that as the number of genes included on a panel increases, the chances of variants of uncertain significance increases.  After considering the benefits and limitations of each gene panel, Meredith [name]. Quam requested to have a more targeted panel that included genes associated with breast and gynecological  cancers.   The Breast and Gyn Cancers Guidelines-Based Panel offered by [facility] includes sequencing and deletion/duplication studies of the following 20 genes: ATM, BARD1, BRCA1, BRCA2, BRIP1, CHD1, CHEK2, EPCAM (Deletion/duplication testing only), MLH1, MSH2, MSH6, NBN, Nf1, PALB2, PMS2, PTEN, RAD51C, RAD51D, STK11, TP53.  Based on Meredith [name]. Auten's personal history of DCIS and family history of prostate cancer, she meets medical criteria for genetic testing. Despite that she meets criteria, she may still have an out of pocket cost. We discussed that if her out of pocket cost for testing is over $100, the laboratory will call and confirm whether she wants to proceed with testing.  If the out of pocket cost of testing is less than $100 she will be billed by the genetic testing laboratory.   PLAN: After considering the risks, benefits, and limitations, Meredith [name]. Maharaj provided informed consent to pursue genetic testing and the blood sample was sent to [facility] for analysis of the STAT Breast Cancer Panel and Breast/GYN Panel. Results should be available within approximately 10 days for the STAT Panel and 3 weeks for the Breast/GYN Panel.  Results will be disclosed by telephone to Meredith [name]. Reeg, as will any additional recommendations warranted by these results. Meredith [name]. Sarnowski will receive a summary of her genetic counseling visit and a copy of her results once available. This information will also be available in Epic.   Lastly, we encouraged Meredith [name]. Anspach to remain in contact with cancer genetics annually so that we can continuously update the family history and inform her of any changes in cancer genetics and testing that may be of benefit for this family.   Meredith [name]. Bribiesca questions were answered to her satisfaction today. Our contact information was provided should additional questions or concerns arise. Thank you for the referral and allowing [place] to share in the care of your patient.   Casmer Yepiz M. Joette Catching, [email or web address] (P) [phone]  The patient was seen for a total of 20 minutes in televideo genetic counseling.  This patient was discussed with Drs. Magrinat, Lindi Adie and/or Burr Medico who agrees with the above.    _______________________________________________________________________ For Office Staff:  Number of people involved in session: 1 Was an Intern/ student involved with case: no

## 2020-02-21 ENCOUNTER — Telehealth: Payer: Self-pay | Admitting: Hematology and Oncology

## 2020-02-21 NOTE — Telephone Encounter (Signed)
No 9/1 los, no changes made to pt schedule

## 2020-02-25 ENCOUNTER — Other Ambulatory Visit: Payer: Self-pay

## 2020-02-25 ENCOUNTER — Telehealth: Payer: Self-pay | Admitting: Genetic Counselor

## 2020-02-25 ENCOUNTER — Ambulatory Visit (HOSPITAL_COMMUNITY)
Admission: RE | Admit: 2020-02-25 | Discharge: 2020-02-25 | Disposition: A | Discharge status: Home / Self Care | Payer: BC Managed Care – PPO | Source: Ambulatory Visit | Attending: Surgery | Admitting: Surgery

## 2020-02-25 DIAGNOSIS — D0511 Intraductal carcinoma in situ of right breast: Secondary | ICD-10-CM | POA: Diagnosis present

## 2020-02-25 MED ORDER — GADOBUTROL 1 MMOL/ML IV SOLN
7.0000 mL | Freq: Once | INTRAVENOUS | Status: AC | PRN
  Administered 2020-02-25: 7 mL via INTRAVENOUS

## 2020-02-25 NOTE — Telephone Encounter (Signed)
Contacted patient in attempt to disclose results of genetic testing.  LVM with contact information requesting a call back.  

## 2020-02-26 ENCOUNTER — Encounter: Payer: Self-pay | Admitting: Genetic Counselor

## 2020-02-26 ENCOUNTER — Ambulatory Visit: Payer: Self-pay | Admitting: Genetic Counselor

## 2020-02-26 DIAGNOSIS — Z1379 Encounter for other screening for genetic and chromosomal anomalies: Secondary | ICD-10-CM | POA: Insufficient documentation

## 2020-02-26 DIAGNOSIS — D0511 Intraductal carcinoma in situ of right breast: Secondary | ICD-10-CM

## 2020-02-26 DIAGNOSIS — Z8042 Family history of malignant neoplasm of prostate: Secondary | ICD-10-CM

## 2020-02-26 NOTE — Progress Notes (Signed)
Nutrition  Patient identified by attending Breast Clinic on 02/19/2020.  Patient was given nutrition packet with RD contact information by nurse navigator.   Chart reviewed.    49 year old female with DCIS of right breast. Planning lumpectomy, radiation and antiestrogens.  Ht: 63.5 inches Wt: 152 lb BMI: 26  Patient currently not at nutritional risk.  Please consult RD if changes in nutritional status occur.  Yanely Mast B. Zenia Resides, [place], [place] Registered Dietitian [phone] (mobile)

## 2020-02-26 NOTE — Progress Notes (Signed)
HPI:  Meredith [name] was previously seen in the [facility] due to a personal history of ductal carcinoma in situ, a family history of prostate and gynecological cancer, and concerns regarding a hereditary predisposition to cancer. Please refer to our prior cancer genetics clinic note for more information regarding our discussion, assessment and recommendations, at the time. Meredith [name] recent genetic test results were disclosed to her, as were recommendations warranted by these results. These results and recommendations are discussed in more detail below.  CANCER HISTORY:  Oncology History  Ductal carcinoma in situ (DCIS) of right breast  02/14/2020 Initial Diagnosis   Screening mammogram detected right breast calcifications spanning 1.4cm. Biopsy showed DCIS, high grade, ER+ 95%, PR+ 30%.   02/19/2020 Cancer Staging   Staging form: Breast, AJCC 8th Edition - Clinical stage from 02/19/2020: Stage 0 (cTis (DCIS), cN0, cM0, ER+, PR+) - Signed by Nicholas Lose, MD on 02/19/2020   02/25/2020 Genetic Testing   No pathogenic variants detected in [facility] gene panel.  The Breast/GYN Cancer Panel offered by [facility] includes sequencing and rearrangement analysis for the following 20 genes:  ATM, BARD1, BRCA1, BRCA2, BRIP1, CDH1, CHEK2, EPCAM (deletion/duplication analysis only), MLH1, MSH2, MSH6, NBN, NF1, PALB2, PMS2, PTEN, RAD51C, RAD51D, STK11 and TP53.  The report date is February 25, 2020.      FAMILY HISTORY:  We obtained a detailed, 4-generation family history.  Significant diagnoses are listed below: Family History  Problem Relation Age of Onset  . Prostate cancer Maternal Grandfather   . Skin cancer Maternal Grandfather   . Cancer Maternal Aunt        dx mid 48s; GYN cancer       Meredith [name] has one daughter, age 96, and one son, age 50, both without a history of cancer.  Meredith [name] has one brother and one sister.  No cancer history was reported in her siblings or their children.   Meredith [name] mother is 67 years old without a cancer history.  Meredith [name] maternal aunt was diagnosed with a gynecological cancer in her mid 12s.  She has limited information about her aunt's cancer.  Meredith [name] maternal grandfather was diagnosed with skin cancer in his mid 4s, prostate cancer in his early 83s, and passed away in his mid 67s.  No other maternal family history of cancer was reported.  Meredith [name] father died by suicide at age 67.  No paternal family history of cancer was reported.   Meredith [name] is unaware of previous family history of genetic testing for hereditary cancer risks. Patient's maternal ancestors are of [place] descent, and paternal ancestors are of [place] and [place] descent. There is no reported Ashkenazi Jewish ancestry. There is no known consanguinity.  GENETIC TEST RESULTS: Genetic testing reported out on February 25, 2020. The Breast/GYN Cancer Panel through [facility] found no pathogenic mutations. The Breast and Gyn Cancers Guidelines-Based Panel offered by [facility] includes sequencing and deletion/duplication studies of the following 20 genes: ATM, BARD1, BRCA1, BRCA2, BRIP1, CHD1, CHEK2, EPCAM (Deletion/duplication testing only), MLH1, MSH2, MSH6, NBN, Nf1, PALB2, PMS2, PTEN, RAD51C, RAD51D, STK11, TP53. The test report has been scanned into EPIC and is located under the Molecular Pathology section of the Results Review tab.  A portion of the result report is included below for reference.     We discussed with Meredith [name] that because current genetic testing is not perfect, it is possible there may be a gene mutation in one of these genes that current  testing cannot detect, but that chance is small.  We also discussed, that there could be another gene that has not yet been discovered, or that we have not yet tested, that is responsible for the cancer diagnoses in the family. It is also possible there is a hereditary cause for the cancer in the family that Meredith [name] did not inherit and  therefore was not identified in her testing.  Therefore, it is important to remain in touch with cancer genetics in the future so that we can continue to offer Meredith [name] the most up to date genetic testing.   ADDITIONAL GENETIC TESTING: We discussed with Meredith [name] that there are other genes that are associated with increased cancer risk that can be analyzed. Should Meredith [name] wish to pursue additional genetic testing, we are happy to discuss and coordinate this testing, at any time.    CANCER SCREENING RECOMMENDATIONS: Meredith [name] test result is considered negative (normal).  This means that we have not identified a hereditary cause for her personal history of DCIS or family history of gynecological or prostate cancer at this time. Most cancers happen by chance and this negative test suggests that her cancer may fall into this category.    While reassuring, this does not definitively rule out a hereditary predisposition to cancer. It is still possible that there could be genetic mutations that are undetectable by current technology. There could be genetic mutations in genes that have not been tested or identified to increase cancer risk.  Therefore, it is recommended she continue to follow the cancer management and screening guidelines provided by her oncology and primary healthcare provider.   An individual's cancer risk and medical management are not determined by genetic test results alone. Overall cancer risk assessment incorporates additional factors, including personal medical history, family history, and any available genetic information that may result in a personalized plan for cancer prevention and surveillance  RECOMMENDATIONS FOR FAMILY MEMBERS:  Individuals in this family might be at some increased risk of developing cancer, over the general population risk, simply due to the family history of cancer.  We recommended women in this family have a yearly mammogram beginning at age 87, or 39 years  younger than the earliest onset of cancer, an annual clinical breast exam, and perform monthly breast self-exams. Women in this family should also have a gynecological exam as recommended by their primary provider. All family members should be referred for colonoscopy starting at age 52.  It is also possible there is a hereditary cause for the cancer in Meredith [name]'s family that she did not inherit and therefore was not identified in her.  Based on Meredith [name]'s family history, we recommended her aunt, who was diagnosed with a gynecological cancer, possibly ovarian cancer, at age 52, have genetic counseling. Ms. Sirianni will let us know if we can be of any assistance in coordinating genetic counseling and/or testing for this family member.   FOLLOW-UP: Lastly, we discussed with Ms. Hilgeman that cancer genetics is a rapidly advancing field and it is possible that new genetic tests will be appropriate for her and/or her family members in the future. We encouraged her to remain in contact with cancer genetics on an annual basis so we can update her personal and family histories and let her know of advances in cancer genetics that may benefit this family.   Our contact number was provided. Ms. Lennon questions were answered to her satisfaction, and she knows she is welcome to  call us at anytime with additional questions or concerns.    Missy Baksh M. Joette Catching, [place], [facility] Certified Film/video [email or web address] (P) [phone]

## 2020-02-26 NOTE — Telephone Encounter (Signed)
Revealed negative genetic testing.  Discussed that we do not know why she has DCIS or why there is cancer in the family. It could be due to a different gene that we are not testing, or maybe our current technology may not be able to pick something up.  It will be important for her to keep in contact with genetics to keep up with whether additional testing may be needed.

## 2020-02-27 ENCOUNTER — Telehealth: Payer: Self-pay | Admitting: *Deleted

## 2020-02-27 ENCOUNTER — Encounter: Payer: Self-pay | Admitting: *Deleted

## 2020-02-27 DIAGNOSIS — D0511 Intraductal carcinoma in situ of right breast: Secondary | ICD-10-CM

## 2020-02-27 NOTE — Telephone Encounter (Signed)
Spoke to pt concerning [facility]C from 9.1.21. Denies questions or concerns regarding dx or treatment care plan. Discussed MRI results and need for 2 MR bx on right. Encourage pt to call with needs. Received verbal understanding.

## 2020-02-28 ENCOUNTER — Ambulatory Visit
Admission: RE | Admit: 2020-02-28 | Discharge: 2020-02-28 | Disposition: A | Discharge status: Home / Self Care | Payer: BC Managed Care – PPO | Source: Ambulatory Visit | Attending: Surgery | Admitting: Surgery

## 2020-02-28 ENCOUNTER — Other Ambulatory Visit: Payer: Self-pay | Admitting: Surgery

## 2020-02-28 DIAGNOSIS — M899 Disorder of bone, unspecified: Secondary | ICD-10-CM

## 2020-02-28 DIAGNOSIS — D0511 Intraductal carcinoma in situ of right breast: Secondary | ICD-10-CM

## 2020-03-02 ENCOUNTER — Encounter: Payer: Self-pay | Admitting: *Deleted

## 2020-03-05 ENCOUNTER — Other Ambulatory Visit: Payer: Self-pay | Admitting: Surgery

## 2020-03-05 DIAGNOSIS — M899 Disorder of bone, unspecified: Secondary | ICD-10-CM

## 2020-03-16 ENCOUNTER — Ambulatory Visit
Admission: RE | Admit: 2020-03-16 | Discharge: 2020-03-16 | Disposition: A | Discharge status: Home / Self Care | Payer: BC Managed Care – PPO | Source: Ambulatory Visit | Attending: Surgery | Admitting: Surgery

## 2020-03-16 DIAGNOSIS — M899 Disorder of bone, unspecified: Secondary | ICD-10-CM

## 2020-03-17 ENCOUNTER — Ambulatory Visit
Admission: RE | Admit: 2020-03-17 | Discharge: 2020-03-17 | Disposition: A | Discharge status: Home / Self Care | Payer: BC Managed Care – PPO | Source: Ambulatory Visit | Attending: Surgery | Admitting: Surgery

## 2020-03-17 ENCOUNTER — Other Ambulatory Visit: Payer: Self-pay

## 2020-03-17 ENCOUNTER — Other Ambulatory Visit (HOSPITAL_COMMUNITY): Payer: Self-pay | Admitting: Diagnostic Radiology

## 2020-03-17 DIAGNOSIS — D0511 Intraductal carcinoma in situ of right breast: Secondary | ICD-10-CM

## 2020-03-17 MED ORDER — GADOBUTROL 1 MMOL/ML IV SOLN
7.0000 mL | Freq: Once | INTRAVENOUS | Status: AC | PRN
  Administered 2020-03-17: 7 mL via INTRAVENOUS

## 2020-03-18 ENCOUNTER — Encounter: Payer: Self-pay | Admitting: *Deleted

## 2020-03-23 ENCOUNTER — Encounter: Payer: Self-pay | Admitting: *Deleted

## 2020-03-27 ENCOUNTER — Encounter: Payer: Self-pay | Admitting: *Deleted

## 2020-03-29 ENCOUNTER — Other Ambulatory Visit: Payer: Self-pay | Admitting: Surgery

## 2020-03-29 DIAGNOSIS — D0591 Unspecified type of carcinoma in situ of right breast: Secondary | ICD-10-CM

## 2020-04-06 ENCOUNTER — Encounter: Payer: Self-pay | Admitting: *Deleted

## 2020-04-13 ENCOUNTER — Encounter: Payer: Self-pay | Admitting: *Deleted

## 2020-04-15 ENCOUNTER — Telehealth: Payer: Self-pay | Admitting: Hematology and Oncology

## 2020-04-15 NOTE — Telephone Encounter (Signed)
Scheduled appt per 10/25 sch msg - mailed reminder letter with appt date and time

## 2020-04-21 ENCOUNTER — Encounter: Payer: Self-pay | Admitting: *Deleted

## 2020-04-27 NOTE — H&P (Signed)
Subjective:     Patient ID: Meredith [name] is a 49 y.o. female.  HPI  Here for follow up discussion breast reconstruction prior to planned right mastectomy. Presented following screening MMG with right breast calcifications spanning 1.4 cm. Biopsy labeled right breast LIQ demonstrated DCIS high grade ER/PR+. MRI showed NME throughout the inferior right breast, 7.8 cm x 6.8 x 3.2 cm. There were no defined masses, no abnormal appearing LN. 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 Two additional biopsies labeled right breast LOQ anterior extent and right breast posterior extent both showed DCIS with focal necrosis. Given extent of disease mastectomy recommended. Dr. Lucia Gaskins has counseled patient not candidate for NSM given location and span disease.  Genetics negative  Current 36 B/C, would not mind smaller. Wt stable.  Patient is realtor. Lives with spouse who accompanies her today. One son in college, daughter is family practice NP in [place].   Review of Systems     Objective:   Physical Exam Cardiovascular:     Rate and Rhythm: Normal rate and regular rhythm.     Heart sounds: Normal heart sounds.  Pulmonary:     Effort: Pulmonary effort is normal.     Breath sounds: Normal breath sounds.  Abdominal:     Comments: Sufficient volume for reconstruction C section scar present   no palpable masses right breast SN to nipple R 24.5 L 26.5 cm BW R 24 L 24 cm CW 14 cm Nipple to IMF R 9 L 9 cm    Assessment:     Right breast DCIS    Plan:     Plan right breast skin reduction mastectomy with tissue expander, acellular dermis reconstruction.  Reviewedanchor shape scar, drains, OR length, hospital stay and recovery.Reviewedprocess of expansion and implant based risks including rupture,imagingsurveillance for silicone implants, infection requiring surgery or removal, contracture. Discussed future surgery dependent on adjuvant  treatments.  Reviewed reconstruction will be asensate and not stimulate. Reviewed risks mastectomy flap necrosis requiring additional surgery.  Discussed use of acellular dermis in reconstruction, cadaveric source, incorporation over several weeks, risk that if has seroma or infection can act as additional nidus for infection if not incorporated.Reviewed this is an off label use of acellular dermis.  Reviewedprepectoral vs sub pectoral reconstruction. Discussed with patient and benefit of this is no animation deformity, may be less pain. Risk may be more visible rippling over upper poles, greater need of ADM. Reviewed pre pectoral would require larger amount acellular dermis, more drains. Discussed any type reconstruction also risks long term displacement implant and visible rippling. If prepectoral counseled I would recommend she be comfortable with silicone implants as more options that have less rippling. Sheagrees to prepectoral placement.  Additional risks including but not limited to bleeding, hematoma, seroma, damage to adjacent structures, need for additional procedures, blood clots in legs or lungs reviewed.  Discussed risk COVID infectionthrough this elective surgery. Patient will receive COVID testing prior to surgery. Discussed even if patient receivesa negative test result, the tests in some cases may fail to detect the virus or patient maycontract COVID after the test.COVID 19 infectionbefore/during/aftersurgery may result in lead to a higher chance of complication and death.  Rx for [place] given. Drain teaching completed. Rx forNorco, Bactrim, and Robaxin given.

## 2020-05-06 NOTE — Progress Notes (Signed)
Your procedure is scheduled on Monday, November 22nd.  Report to [facility] Main Entrance "A" at 10:45 A.M., and check in at the Admitting office.  Call this number if you have problems the morning of surgery:  [phone]  Call [phone] if you have any questions prior to your surgery date Monday-Friday 8am-4pm   Remember:  Do not eat after midnight the night before your surgery  You may drink clear liquids until 9:45 A.M. the morning of your surgery.   Clear liquids allowed are: Water, Non-Citrus Juices (without pulp), Carbonated Beverages, Clear Tea, Black Coffee Only, and Gatorade    Take these medicines the morning of surgery with A SIP OF WATER  levothyroxine (SYNTHROID)   If needed: cetirizine (ZYRTEC)  As of today, STOP taking any Aspirin (unless otherwise instructed by your surgeon) Aleve, Naproxen, Ibuprofen, Motrin, Advil, Goody's, BC's, all herbal medications, fish oil, and all vitamins.                     Do not wear jewelry, make up, or nail polish            Do not wear lotions, powders, perfumes, or deodorant.            Do not shave 48 hours prior to surgery.              Do not bring valuables to the hospital.            [facility] is not responsible for any belongings or valuables.  Do NOT Smoke (Tobacco/Vaping) or drink Alcohol 24 hours prior to your procedure If you use a CPAP at night, you may bring all equipment for your overnight stay.   Contacts, glasses, dentures or bridgework may not be worn into surgery.      For patients admitted to the hospital, discharge time will be determined by your treatment team.   Patients discharged the day of surgery will not be allowed to drive home, and someone needs to stay with them for 24 hours.  Special instructions:   [place]- Preparing For Surgery  Before surgery, you can play an important role. Because skin is not sterile, your skin needs to be as free of germs as possible. You can reduce the number of  germs on your skin by washing with CHG (chlorahexidine gluconate) Soap before surgery.  CHG is an antiseptic cleaner which kills germs and bonds with the skin to continue killing germs even after washing.    Oral Hygiene is also important to reduce your risk of infection.  Remember - BRUSH YOUR TEETH THE MORNING OF SURGERY WITH YOUR REGULAR TOOTHPASTE  Please do not use if you have an allergy to CHG or antibacterial soaps. If your skin becomes reddened/irritated stop using the CHG.  Do not shave (including legs and underarms) for at least 48 hours prior to first CHG shower. It is OK to shave your face.  Please follow these instructions carefully.   1. Shower the NIGHT BEFORE SURGERY and the MORNING OF SURGERY with CHG Soap.   2. If you chose to wash your hair, wash your hair first as usual with your normal shampoo.  3. After you shampoo, rinse your hair and body thoroughly to remove the shampoo.  4. Use CHG as you would any other liquid soap. You can apply CHG directly to the skin and wash gently with a scrungie or a clean washcloth.   5. Apply the CHG Soap to your  body ONLY FROM THE NECK DOWN.  Do not use on open wounds or open sores. Avoid contact with your eyes, ears, mouth and genitals (private parts). Wash Face and genitals (private parts)  with your normal soap.   6. Wash thoroughly, paying special attention to the area where your surgery will be performed.  7. Thoroughly rinse your body with warm water from the neck down.  8. DO NOT shower/wash with your normal soap after using and rinsing off the CHG Soap.  9. Pat yourself dry with a CLEAN TOWEL.  10. Wear CLEAN PAJAMAS to bed the night before surgery  11. Place CLEAN SHEETS on your bed the night of your first shower and DO NOT SLEEP WITH PETS.  Day of Surgery: Wear Clean/Comfortable clothing the morning of surgery Do not apply any deodorants/lotions.   Remember to brush your teeth WITH YOUR REGULAR TOOTHPASTE.   Please  read over the following fact sheets that you were given.

## 2020-05-07 ENCOUNTER — Other Ambulatory Visit (HOSPITAL_COMMUNITY)
Admission: RE | Admit: 2020-05-07 | Discharge: 2020-05-07 | Disposition: A | Discharge status: Home / Self Care | Payer: BC Managed Care – PPO | Source: Ambulatory Visit | Attending: Surgery | Admitting: Surgery

## 2020-05-07 ENCOUNTER — Other Ambulatory Visit: Payer: Self-pay

## 2020-05-07 ENCOUNTER — Encounter (HOSPITAL_COMMUNITY)
Admission: RE | Admit: 2020-05-07 | Discharge: 2020-05-07 | Disposition: A | Discharge status: Home / Self Care | Payer: BC Managed Care – PPO | Source: Ambulatory Visit | Attending: Surgery | Admitting: Surgery

## 2020-05-07 ENCOUNTER — Encounter (HOSPITAL_COMMUNITY): Payer: Self-pay

## 2020-05-07 DIAGNOSIS — Z20822 Contact with and (suspected) exposure to covid-19: Secondary | ICD-10-CM | POA: Insufficient documentation

## 2020-05-07 DIAGNOSIS — Z01812 Encounter for preprocedural laboratory examination: Secondary | ICD-10-CM | POA: Diagnosis present

## 2020-05-07 LAB — CBC
HCT: 40.9 % (ref 36.0–46.0)
Hemoglobin: 13.2 g/dL (ref 12.0–15.0)
MCH: 29.8 pg (ref 26.0–34.0)
MCHC: 32.3 g/dL (ref 30.0–36.0)
MCV: 92.3 fL (ref 80.0–100.0)
Platelets: 250 10*3/uL (ref 150–400)
RBC: 4.43 MIL/uL (ref 3.87–5.11)
RDW: 11.4 % — ABNORMAL LOW (ref 11.5–15.5)
WBC: 4.5 10*3/uL (ref 4.0–10.5)
nRBC: 0 % (ref 0.0–0.2)

## 2020-05-07 NOTE — Progress Notes (Signed)
PCP - Dr. Elenor Quinones Cardiologist - Denies  PPM/ICD - Denies  Chest x-ray - N/A EKG - N/A Stress Test - Denies ECHO - Denies Cardiac Cath - Denies  Sleep Study - Denies  Patient denies having diabetes.  Blood Thinner Instructions: N/A Aspirin Instructions: N/A  ERAS Protcol - Yes PRE-SURGERY Ensure or G2- No  COVID TEST- 05/07/20   Coronavirus Screening  Have you experienced the following symptoms:  Cough yes/no: No Fever (>100.3F)  yes/no: No Runny nose yes/no: No Sore throat yes/no: No Difficulty breathing/shortness of breath  yes/no: No  Have you or a family member traveled in the last 14 days and where? yes/no: No   If the patient indicates "YES" to the above questions, their PAT will be rescheduled to limit the exposure to others and, the surgeon will be notified. THE PATIENT WILL NEED TO BE ASYMPTOMATIC FOR 14 DAYS.   If the patient is not experiencing any of these symptoms, the PAT nurse will instruct them to NOT bring anyone with them to their appointment since they may have these symptoms or traveled as well.   Please remind your patients and families that hospital visitation restrictions are in effect and the importance of the restrictions.     Anesthesia review: No  Patient denies shortness of breath, fever, cough and chest pain at PAT appointment   All instructions explained to the patient, with a verbal understanding of the material. Patient agrees to go over the instructions while at home for a better understanding. Patient also instructed to self quarantine after being tested for COVID-19. The opportunity to ask questions was provided.

## 2020-05-08 LAB — SARS CORONAVIRUS 2 (TAT 6-24 HRS): SARS Coronavirus 2: NEGATIVE

## 2020-05-10 NOTE — H&P (Signed)
Meredith [name]  Location: [facility] Surgery Patient #: [id number] DOB: 1971/03/22 Undefined / Language: Meredith [name] / Race: White Female  History of Present Illness   The patient is a 49 year old female who presents with a complaint of breast cancer.  The PCP is Dr. Luan Moore  GYN - Dr. Marjorie Smolder  The patient is at the [facility] - Oncology is Drs. Lindi Adie and [place] She is accompanied with her husband.  She went for her annual mammograms. She has no prior history of breast biopsy or mass. Her biopsy experience was not good.  [facility] - showed a 1.4 cm microca++ in the lower inner quadrant of the right breast. Her breast density is "c" - and radiology suggested an MRI for further evaluation. Biopsy: Right breast biopsy - 02/11/2020 - lower inner quadrant, DCIS, high grade, ER - 95%, PR - 30% Family history of breast or ovarian cancer: No On hormone therapy: Has Mirena IUD (Dr. Lindi Adie will discuss management)  I discussed the options for breast cancer treatment with the patient. The patient is at the [facility], which includes medical oncology and radiation oncology. I discussed the surgical options of lumpectomy vs. mastectomy. If mastectomy, there is the possibility of reconstruction. I discussed the options of lymph node biopsy, but it is not needed for her DCIS The treatment plan depends on the pathologic staging of the tumor and the patient's personal wishes. The risks of surgery include, but are not limited to, bleeding, infection, the need for further surgery, and nerve injury. The patient has been given literature on the treatment of breast cancer.  Plan: 1. MRI to evaluate breast, 2. right breast lumpectomy (seed localization), 3. Genetics (she is thinking about this), 4. Rad Tx, 5. Antiestrogen  Past Medical History: 1. Guaiac positive stools - she is working on  getting a colonoscopy 2. Thyroid replacement 3. She has had the vaccine for Covid 4. She has a Mirena IUD - discussed with Dr. Lindi Adie. He will often leave these if the patient is on antihormone therapy  Social History: Married, husband Meredith [name] Daughter, Meredith [name], [place] (NP at [employer]) - (she has a 63 week old grandchild), Meredith [name] 54 yo She works Engineer, maintenance. She is somewhat high strung. Her husband sells insurance.   Medication History Conni Slipper, RN; 02/19/2020 7:55 AM) Medications Reconciled   Physical Exam  General: WN WF who is alert and generally healthy appearing. She is wearing a mask. HEENT: Normal. Pupils equal.  Neck: Supple. No mass. No thyroid mass. Lymph Nodes: No supraclavicular, cervical or axillary nodes.  Lungs: Clear to auscultation and symmetric breath sounds. Heart: RRR. No murmur or rub.  Breasts: Right - bruise at 4 o'clock, about 4 cm off the areola. I feel a mass effect - probably more bruising. It is also tender.  Left - no mass or nodule  Abdomen: Soft. No mass. No tenderness. No hernia. Normal bowel sounds. Pfannenstiel incision. Rectal: Not done.  Extremities: Good strength and ROM in upper and lower extremities.  Neurologic: Grossly intact to motor and sensory function. Psychiatric: Has normal mood and affect. Behavior is normal.    Assessment & Plan  1. BREAST CANCER, STAGE 0, RIGHT (D05.91)  Story: Right breast biopsy - 02/11/2020 - lower inner quadrant, DCIS, high grade, ER - 95%, PR - 30%  Oncology - [facility]/M[facility]y  Plan:   1. MRI to evaluate breast,   Brasts MRI -  02/25/2020 1. Linear and clumped non mass enhancement (7.8 cm x 6.8 cm x 3.2 cm) throughout the lower aspect of the right breast, which includes the previously biopsied area of high grade DCIS. This is suspicious for a significantly larger area of DCIS that warrants additional tissue sampling.    2. No evidence of left breast malignancy.    3.  Nonaggressive/benign appearing bone lesion in the superior sternal manubrium. Recommend follow-up sternal radiographs for further assessment.   RECOMMENDATION:    1. Recommend 2 MRI guided biopsies. Recommend biopsy of the anterior and posterior extent of the inferior right breast non mass enhancement, targeting the anterior, lower outer quadrant as well as a posterior extent of the enhancement    2. Follow-up radiographs of the sternum to assess the benign-appearing lesion seen on the breast MRI.   Addendum Note(Raynard Mapps H. Laylamarie Meuser MD; 03/18/2020 3:31 PM)    Both her MRI guided biopsies (03/17/2020) of her right breast showed DCIS. I reviewed the biopsies/MRI with Dr. Alejandro Mulling. It appears that the lower half of her right breast has DCIS.   2. Right mastectomy with reconstruction (Dr. Iran Planas), right axillary SLNBx   Addendum Note(Iann Rodier H. Lucia Gaskins MD; 04/07/2020 10:22 AM)  This patient is scheduled for surgery on 05/11/2020 @ 12:30 PM [facility]. This is coordinated with Dr. Iran Planas.   RT MASTY W/RT AX SLNB   3. Genetics (she is thinking about this),   4. Antiestrogen  2.  GUAIAC POSITIVE STOOLS (R19.5) 3.  Bone cyst of manubrium  Addendum Note(Neri Vieyra H. Lucia Gaskins MD; 03/17/2020 8:20 AM)  CT scan on 03/16/2020 shows benign bone cyst of manubrium. Two small left lower lobe nodules too small to characterize - will need follow up.  4. Thyroid replacement   Alphonsa Overall, MD, [facility] Surgery Office phone:  [phone]

## 2020-05-11 ENCOUNTER — Observation Stay (HOSPITAL_COMMUNITY)
Admission: RE | Admit: 2020-05-11 | Discharge: 2020-05-12 | Disposition: A | Discharge status: Home / Self Care | Payer: BC Managed Care – PPO | Attending: Plastic Surgery | Admitting: Plastic Surgery

## 2020-05-11 ENCOUNTER — Ambulatory Visit (HOSPITAL_COMMUNITY): Payer: BC Managed Care – PPO | Admitting: Anesthesiology

## 2020-05-11 ENCOUNTER — Encounter (HOSPITAL_COMMUNITY)
Admission: RE | Admit: 2020-05-11 | Discharge: 2020-05-11 | Disposition: A | Discharge status: Home / Self Care | Payer: BC Managed Care – PPO | Source: Ambulatory Visit | Attending: Surgery | Admitting: Surgery

## 2020-05-11 ENCOUNTER — Encounter (HOSPITAL_COMMUNITY): Payer: Self-pay | Admitting: Surgery

## 2020-05-11 ENCOUNTER — Encounter (HOSPITAL_COMMUNITY)
Admission: RE | Disposition: A | Discharge status: Home / Self Care | Payer: Self-pay | Source: Home / Self Care | Attending: Plastic Surgery

## 2020-05-11 DIAGNOSIS — D0591 Unspecified type of carcinoma in situ of right breast: Secondary | ICD-10-CM | POA: Insufficient documentation

## 2020-05-11 DIAGNOSIS — D0510 Intraductal carcinoma in situ of unspecified breast: Secondary | ICD-10-CM | POA: Diagnosis present

## 2020-05-11 DIAGNOSIS — D0511 Intraductal carcinoma in situ of right breast: Principal | ICD-10-CM | POA: Insufficient documentation

## 2020-05-11 HISTORY — PX: BREAST RECONSTRUCTION WITH PLACEMENT OF TISSUE EXPANDER AND ALLODERM: SHX6805

## 2020-05-11 HISTORY — PX: MASTECTOMY W/ SENTINEL NODE BIOPSY: SHX2001

## 2020-05-11 HISTORY — PX: AXILLARY SENTINEL NODE BIOPSY: SHX5738

## 2020-05-11 SURGERY — MASTECTOMY WITH SENTINEL LYMPH NODE BIOPSY
Anesthesia: General | Site: Breast | Laterality: Right

## 2020-05-11 MED ORDER — PHENYLEPHRINE HCL-NACL 10-0.9 MG/250ML-% IV SOLN
INTRAVENOUS | Status: DC | PRN
  Administered 2020-05-11: 25 ug/min via INTRAVENOUS

## 2020-05-11 MED ORDER — ORAL CARE MOUTH RINSE: 15.0000 mL | Freq: Once | OROMUCOSAL | Status: AC

## 2020-05-11 MED ORDER — ACETAMINOPHEN 500 MG PO TABS
1000.0000 mg | ORAL_TABLET | ORAL | Status: AC
  Administered 2020-05-11: 1000 mg via ORAL
  Filled 2020-05-11: qty 2

## 2020-05-11 MED ORDER — ACETAMINOPHEN 325 MG PO TABS: 325.0000 mg | ORAL_TABLET | ORAL | Status: DC | PRN

## 2020-05-11 MED ORDER — BUPIVACAINE LIPOSOME 1.3 % IJ SUSP
INTRAMUSCULAR | Status: DC | PRN
  Administered 2020-05-11: 10 mL via PERINEURAL

## 2020-05-11 MED ORDER — OXYCODONE HCL 5 MG PO TABS: 5.0000 mg | ORAL_TABLET | Freq: Once | ORAL | Status: DC | PRN

## 2020-05-11 MED ORDER — KETOROLAC TROMETHAMINE 30 MG/ML IJ SOLN
30.0000 mg | Freq: Three times a day (TID) | INTRAMUSCULAR | Status: AC
  Administered 2020-05-11 – 2020-05-12 (×3): 30 mg via INTRAVENOUS
  Filled 2020-05-11 (×3): qty 1

## 2020-05-11 MED ORDER — DEXAMETHASONE SODIUM PHOSPHATE 10 MG/ML IJ SOLN
INTRAMUSCULAR | Status: DC | PRN
  Administered 2020-05-11: 4 mg via INTRAVENOUS

## 2020-05-11 MED ORDER — FENTANYL CITRATE (PF) 100 MCG/2ML IJ SOLN
INTRAMUSCULAR | Status: AC
  Administered 2020-05-11: 100 ug via INTRAVENOUS
  Filled 2020-05-11: qty 2

## 2020-05-11 MED ORDER — LACTATED RINGERS IV SOLN: INTRAVENOUS | Status: DC

## 2020-05-11 MED ORDER — MIDAZOLAM HCL 2 MG/2ML IJ SOLN
INTRAMUSCULAR | Status: AC
  Filled 2020-05-11: qty 2

## 2020-05-11 MED ORDER — HYDROMORPHONE HCL 1 MG/ML IJ SOLN: 0.5000 mg | INTRAMUSCULAR | Status: DC | PRN

## 2020-05-11 MED ORDER — KCL IN DEXTROSE-NACL 20-5-0.45 MEQ/L-%-% IV SOLN
INTRAVENOUS | Status: DC
  Filled 2020-05-11: qty 1000

## 2020-05-11 MED ORDER — CEFAZOLIN SODIUM-DEXTROSE 1-4 GM/50ML-% IV SOLN
1.0000 g | Freq: Three times a day (TID) | INTRAVENOUS | Status: AC
  Administered 2020-05-11 – 2020-05-12 (×3): 1 g via INTRAVENOUS
  Filled 2020-05-11 (×3): qty 50

## 2020-05-11 MED ORDER — DIPHENHYDRAMINE HCL 25 MG PO CAPS: 25.0000 mg | ORAL_CAPSULE | Freq: Four times a day (QID) | ORAL | Status: DC | PRN

## 2020-05-11 MED ORDER — PHENYLEPHRINE 40 MCG/ML (10ML) SYRINGE FOR IV PUSH (FOR BLOOD PRESSURE SUPPORT)
PREFILLED_SYRINGE | INTRAVENOUS | Status: AC
  Filled 2020-05-11: qty 10

## 2020-05-11 MED ORDER — 0.9 % SODIUM CHLORIDE (POUR BTL) OPTIME
TOPICAL | Status: DC | PRN
  Administered 2020-05-11: 1000 mL

## 2020-05-11 MED ORDER — SODIUM CHLORIDE 0.9 % IV SOLN
INTRAVENOUS | Status: DC | PRN
  Administered 2020-05-11: 500 mL

## 2020-05-11 MED ORDER — MIDAZOLAM HCL 2 MG/2ML IJ SOLN
INTRAMUSCULAR | Status: AC
  Administered 2020-05-11: 2 mg via INTRAVENOUS
  Filled 2020-05-11: qty 2

## 2020-05-11 MED ORDER — ENOXAPARIN SODIUM 40 MG/0.4ML ~~LOC~~ SOLN
40.0000 mg | SUBCUTANEOUS | Status: DC
  Administered 2020-05-12: 40 mg via SUBCUTANEOUS
  Filled 2020-05-11: qty 0.4

## 2020-05-11 MED ORDER — BUPIVACAINE-EPINEPHRINE (PF) 0.5% -1:200000 IJ SOLN
INTRAMUSCULAR | Status: DC | PRN
  Administered 2020-05-11: 20 mL via PERINEURAL

## 2020-05-11 MED ORDER — MEPERIDINE HCL 25 MG/ML IJ SOLN: 6.2500 mg | INTRAMUSCULAR | Status: DC | PRN

## 2020-05-11 MED ORDER — MIDAZOLAM HCL 5 MG/5ML IJ SOLN
INTRAMUSCULAR | Status: DC | PRN
  Administered 2020-05-11: 2 mg via INTRAVENOUS

## 2020-05-11 MED ORDER — CHLORHEXIDINE GLUCONATE 0.12 % MT SOLN
15.0000 mL | Freq: Once | OROMUCOSAL | Status: AC
  Administered 2020-05-11: 15 mL via OROMUCOSAL
  Filled 2020-05-11: qty 15

## 2020-05-11 MED ORDER — ONDANSETRON HCL 4 MG/2ML IJ SOLN
INTRAMUSCULAR | Status: DC | PRN
  Administered 2020-05-11: 4 mg via INTRAVENOUS

## 2020-05-11 MED ORDER — CHLORHEXIDINE GLUCONATE 4 % EX LIQD: 60.0000 mL | Freq: Once | CUTANEOUS | Status: DC

## 2020-05-11 MED ORDER — TECHNETIUM TC 99M TILMANOCEPT KIT
1.0000 | PACK | Freq: Once | INTRAVENOUS | Status: AC | PRN
  Administered 2020-05-11: 1 via INTRADERMAL

## 2020-05-11 MED ORDER — LIDOCAINE HCL (PF) 2 % IJ SOLN
INTRAMUSCULAR | Status: AC
  Filled 2020-05-11: qty 5

## 2020-05-11 MED ORDER — ACETAMINOPHEN 160 MG/5ML PO SOLN: 325.0000 mg | ORAL | Status: DC | PRN

## 2020-05-11 MED ORDER — FENTANYL CITRATE (PF) 100 MCG/2ML IJ SOLN
100.0000 ug | Freq: Once | INTRAMUSCULAR | Status: AC
  Administered 2020-05-11: 100 ug via INTRAVENOUS

## 2020-05-11 MED ORDER — FENTANYL CITRATE (PF) 250 MCG/5ML IJ SOLN
INTRAMUSCULAR | Status: AC
  Filled 2020-05-11: qty 5

## 2020-05-11 MED ORDER — PROPOFOL 10 MG/ML IV BOLUS
INTRAVENOUS | Status: DC | PRN
  Administered 2020-05-11: 140 mg via INTRAVENOUS

## 2020-05-11 MED ORDER — PHENYLEPHRINE 40 MCG/ML (10ML) SYRINGE FOR IV PUSH (FOR BLOOD PRESSURE SUPPORT)
PREFILLED_SYRINGE | INTRAVENOUS | Status: DC | PRN
  Administered 2020-05-11: 80 ug via INTRAVENOUS
  Administered 2020-05-11: 120 ug via INTRAVENOUS
  Administered 2020-05-11: 40 ug via INTRAVENOUS
  Administered 2020-05-11 (×3): 80 ug via INTRAVENOUS

## 2020-05-11 MED ORDER — FENTANYL CITRATE (PF) 100 MCG/2ML IJ SOLN
INTRAMUSCULAR | Status: AC
  Administered 2020-05-11: 25 ug via INTRAVENOUS
  Filled 2020-05-11: qty 2

## 2020-05-11 MED ORDER — LIDOCAINE 2% (20 MG/ML) 5 ML SYRINGE
INTRAMUSCULAR | Status: DC | PRN
  Administered 2020-05-11: 60 mg via INTRAVENOUS

## 2020-05-11 MED ORDER — CEFAZOLIN SODIUM-DEXTROSE 2-4 GM/100ML-% IV SOLN
2.0000 g | INTRAVENOUS | Status: AC
  Administered 2020-05-11: 2 g via INTRAVENOUS
  Filled 2020-05-11: qty 100

## 2020-05-11 MED ORDER — PROPOFOL 10 MG/ML IV BOLUS
INTRAVENOUS | Status: AC
  Filled 2020-05-11: qty 20

## 2020-05-11 MED ORDER — DIPHENHYDRAMINE HCL 50 MG/ML IJ SOLN: 25.0000 mg | Freq: Four times a day (QID) | INTRAMUSCULAR | Status: DC | PRN

## 2020-05-11 MED ORDER — LEVOTHYROXINE SODIUM 100 MCG PO TABS
100.0000 ug | ORAL_TABLET | Freq: Every day | ORAL | Status: DC
  Administered 2020-05-12: 100 ug via ORAL
  Filled 2020-05-11: qty 1

## 2020-05-11 MED ORDER — SODIUM CHLORIDE 0.9 % IV SOLN
Freq: Once | INTRAVENOUS | Status: DC
  Filled 2020-05-11: qty 10

## 2020-05-11 MED ORDER — MIDAZOLAM HCL 2 MG/2ML IJ SOLN: 2.0000 mg | Freq: Once | INTRAMUSCULAR | Status: AC

## 2020-05-11 MED ORDER — HYDROCODONE-ACETAMINOPHEN 5-325 MG PO TABS
1.0000 | ORAL_TABLET | ORAL | Status: DC | PRN
  Administered 2020-05-11 (×2): 2 via ORAL
  Administered 2020-05-12: 1 via ORAL
  Administered 2020-05-12: 2 via ORAL
  Administered 2020-05-12: 1 via ORAL
  Filled 2020-05-11: qty 1
  Filled 2020-05-11 (×3): qty 2
  Filled 2020-05-11: qty 1

## 2020-05-11 MED ORDER — METHOCARBAMOL 500 MG PO TABS
500.0000 mg | ORAL_TABLET | Freq: Four times a day (QID) | ORAL | Status: DC | PRN
  Administered 2020-05-11 – 2020-05-12 (×2): 500 mg via ORAL
  Filled 2020-05-11 (×2): qty 1

## 2020-05-11 MED ORDER — OXYCODONE HCL 5 MG/5ML PO SOLN: 5.0000 mg | Freq: Once | ORAL | Status: DC | PRN

## 2020-05-11 MED ORDER — ONDANSETRON HCL 4 MG/2ML IJ SOLN: 4.0000 mg | Freq: Once | INTRAMUSCULAR | Status: DC | PRN

## 2020-05-11 MED ORDER — CHLORHEXIDINE GLUCONATE 4 % EX LIQD
60.0000 mL | Freq: Once | CUTANEOUS | Status: DC
  Administered 2020-05-11: 4 via TOPICAL

## 2020-05-11 MED ORDER — EPHEDRINE SULFATE-NACL 50-0.9 MG/10ML-% IV SOSY
PREFILLED_SYRINGE | INTRAVENOUS | Status: DC | PRN
  Administered 2020-05-11 (×2): 10 mg via INTRAVENOUS

## 2020-05-11 MED ORDER — FENTANYL CITRATE (PF) 250 MCG/5ML IJ SOLN
INTRAMUSCULAR | Status: DC | PRN
  Administered 2020-05-11: 50 ug via INTRAVENOUS
  Administered 2020-05-11: 25 ug via INTRAVENOUS
  Administered 2020-05-11: 100 ug via INTRAVENOUS
  Administered 2020-05-11: 25 ug via INTRAVENOUS

## 2020-05-11 MED ORDER — FENTANYL CITRATE (PF) 100 MCG/2ML IJ SOLN
25.0000 ug | INTRAMUSCULAR | Status: DC | PRN
  Administered 2020-05-11: 50 ug via INTRAVENOUS
  Administered 2020-05-11: 25 ug via INTRAVENOUS

## 2020-05-11 SURGICAL SUPPLY — 86 items
ADH SKN CLS APL DERMABOND .7 (GAUZE/BANDAGES/DRESSINGS) ×6
ALLOGRAFT PERF 16X20 1.6+/-0.4 (Tissue) ×1 IMPLANT
APL PRP STRL LF DISP 70% ISPRP (MISCELLANEOUS) ×4
APPLIER CLIP 9.375 MED OPEN (MISCELLANEOUS)
APR CLP MED 9.3 20 MLT OPN (MISCELLANEOUS)
BAG DECANTER FOR FLEXI CONT (MISCELLANEOUS) ×3 IMPLANT
BINDER BREAST LRG (GAUZE/BANDAGES/DRESSINGS) IMPLANT
BINDER BREAST XLRG (GAUZE/BANDAGES/DRESSINGS) ×1 IMPLANT
BINDER BREAST XXLRG (GAUZE/BANDAGES/DRESSINGS) ×1 IMPLANT
BIOPATCH RED 1 DISK 7.0 (GAUZE/BANDAGES/DRESSINGS) ×2 IMPLANT
CANISTER SUCT 3000ML PPV (MISCELLANEOUS) ×6 IMPLANT
CHLORAPREP W/TINT 26 (MISCELLANEOUS) ×6 IMPLANT
CLIP APPLIE 9.375 MED OPEN (MISCELLANEOUS) ×2 IMPLANT
CNTNR URN SCR LID CUP LEK RST (MISCELLANEOUS) ×2 IMPLANT
CONT SPEC 4OZ STRL OR WHT (MISCELLANEOUS) ×3
COVER PROBE W GEL 5X96 (DRAPES) ×3 IMPLANT
COVER SURGICAL LIGHT HANDLE (MISCELLANEOUS) ×6 IMPLANT
COVER WAND RF STERILE (DRAPES) ×6 IMPLANT
DERMABOND ADVANCED (GAUZE/BANDAGES/DRESSINGS) ×3
DERMABOND ADVANCED .7 DNX12 (GAUZE/BANDAGES/DRESSINGS) ×6 IMPLANT
DRAIN CHANNEL 15F RND FF W/TCR (WOUND CARE) ×1 IMPLANT
DRAIN CHANNEL 19F RND (DRAIN) ×3 IMPLANT
DRAPE CHEST BREAST 15X10 FENES (DRAPES) ×2 IMPLANT
DRAPE HALF SHEET 40X57 (DRAPES) ×6 IMPLANT
DRAPE ORTHO SPLIT 77X108 STRL (DRAPES) ×6
DRAPE SURG ORHT 6 SPLT 77X108 (DRAPES) ×4 IMPLANT
DRAPE WARM FLUID 44X44 (DRAPES) ×2 IMPLANT
DRSG PAD ABDOMINAL 8X10 ST (GAUZE/BANDAGES/DRESSINGS) ×10 IMPLANT
DRSG TEGADERM 4X4.75 (GAUZE/BANDAGES/DRESSINGS) ×13 IMPLANT
ELECT BLADE 4.0 EZ CLEAN MEGAD (MISCELLANEOUS) ×3
ELECT CAUTERY BLADE 6.4 (BLADE) ×3 IMPLANT
ELECT COATED BLADE 2.86 ST (ELECTRODE) ×3 IMPLANT
ELECT REM PT RETURN 9FT ADLT (ELECTROSURGICAL) ×9
ELECTRODE BLDE 4.0 EZ CLN MEGD (MISCELLANEOUS) ×2 IMPLANT
ELECTRODE REM PT RTRN 9FT ADLT (ELECTROSURGICAL) ×6 IMPLANT
EVACUATOR SILICONE 100CC (DRAIN) ×4 IMPLANT
EXPANDER TISSUE FV FOURTE 400 (Prosthesis & Implant Plastic) IMPLANT
GAUZE SPONGE 4X4 12PLY STRL (GAUZE/BANDAGES/DRESSINGS) ×2 IMPLANT
GLOVE BIO SURGEON STRL SZ 6 (GLOVE) ×8 IMPLANT
GLOVE SURG SYN 7.5  E (GLOVE) ×3
GLOVE SURG SYN 7.5 E (GLOVE) ×2 IMPLANT
GLOVE SURG SYN 7.5 PF PI (GLOVE) ×2 IMPLANT
GOWN STRL REUS W/ TWL LRG LVL3 (GOWN DISPOSABLE) ×6 IMPLANT
GOWN STRL REUS W/ TWL XL LVL3 (GOWN DISPOSABLE) ×2 IMPLANT
GOWN STRL REUS W/TWL LRG LVL3 (GOWN DISPOSABLE) ×9
GOWN STRL REUS W/TWL XL LVL3 (GOWN DISPOSABLE) ×3
ILLUMINATOR WAVEGUIDE N/F (MISCELLANEOUS) IMPLANT
KIT BASIN OR (CUSTOM PROCEDURE TRAY) ×6 IMPLANT
KIT TURNOVER KIT B (KITS) ×6 IMPLANT
LIGHT WAVEGUIDE WIDE FLAT (MISCELLANEOUS) IMPLANT
MARKER SKIN DUAL TIP RULER LAB (MISCELLANEOUS) ×6 IMPLANT
NDL 18GX1X1/2 (RX/OR ONLY) (NEEDLE) IMPLANT
NDL FILTER BLUNT 18X1 1/2 (NEEDLE) IMPLANT
NDL HYPO 25GX1X1/2 BEV (NEEDLE) IMPLANT
NEEDLE 18GX1X1/2 (RX/OR ONLY) (NEEDLE) IMPLANT
NEEDLE FILTER BLUNT 18X 1/2SAF (NEEDLE)
NEEDLE FILTER BLUNT 18X1 1/2 (NEEDLE) IMPLANT
NEEDLE HYPO 25GX1X1/2 BEV (NEEDLE) IMPLANT
NS IRRIG 1000ML POUR BTL (IV SOLUTION) ×9 IMPLANT
PACK GENERAL/GYN (CUSTOM PROCEDURE TRAY) ×6 IMPLANT
PAD ABD 8X10 STRL (GAUZE/BANDAGES/DRESSINGS) ×1 IMPLANT
PAD ARMBOARD 7.5X6 YLW CONV (MISCELLANEOUS) ×6 IMPLANT
PENCIL SMOKE EVACUATOR (MISCELLANEOUS) ×3 IMPLANT
PIN SAFETY STERILE (MISCELLANEOUS) ×4 IMPLANT
SET ASEPTIC TRANSFER (MISCELLANEOUS) ×3 IMPLANT
SOL PREP POV-IOD 4OZ 10% (MISCELLANEOUS) ×3 IMPLANT
SPECIMEN JAR X LARGE (MISCELLANEOUS) ×3 IMPLANT
STAPLER VISISTAT 35W (STAPLE) ×3 IMPLANT
SUT CHROMIC 4 0 PS 2 18 (SUTURE) ×3 IMPLANT
SUT ETHILON 2 0 FS 18 (SUTURE) ×3 IMPLANT
SUT MNCRL AB 4-0 PS2 18 (SUTURE) ×5 IMPLANT
SUT SILK 2 0 PERMA HAND 18 BK (SUTURE) ×3 IMPLANT
SUT SILK 2 0 SH (SUTURE) ×1 IMPLANT
SUT VIC AB 0 CT2 27 (SUTURE) ×2 IMPLANT
SUT VIC AB 3-0 SH 18 (SUTURE) ×3 IMPLANT
SUT VIC AB 3-0 SH 27 (SUTURE) ×3
SUT VIC AB 3-0 SH 27X BRD (SUTURE) IMPLANT
SUT VIC AB 4-0 PS2 18 (SUTURE) ×1 IMPLANT
SUT VICRYL 4-0 PS2 18IN ABS (SUTURE) IMPLANT
SUT VLOC 180 0 24IN GS25 (SUTURE) ×1 IMPLANT
SYR BULB IRRIG 60ML STRL (SYRINGE) ×3 IMPLANT
SYR CONTROL 10ML LL (SYRINGE) IMPLANT
TISSUE EXPNDR FV FOURTE 400 (Prosthesis & Implant Plastic) ×3 IMPLANT
TOWEL GREEN STERILE (TOWEL DISPOSABLE) ×6 IMPLANT
TOWEL GREEN STERILE FF (TOWEL DISPOSABLE) ×6 IMPLANT
TRAY FOLEY MTR SLVR 16FR STAT (SET/KITS/TRAYS/PACK) IMPLANT

## 2020-05-11 NOTE — Transfer of Care (Signed)
Immediate Anesthesia Transfer of Care Note  Patient: Meredith [name]  Procedure(s) Performed: RIGHT MASTECTOMY (Right Breast) AXILLARY SENTINEL LYMPH NODE BIOPSY (Right Axilla) BREAST RECONSTRUCTION WITH PLACEMENT OF TISSUE EXPANDER AND ALLODERM (Right Breast)  Patient Location: PACU  Anesthesia Type:GA combined with regional for post-op pain  Level of Consciousness: awake, alert , oriented and patient cooperative  Airway & Oxygen Therapy: Patient Spontanous Breathing  Post-op Assessment: Report given to RN, Post -op Vital signs reviewed and stable and Patient moving all extremities X 4  Post vital signs: Reviewed and stable  Last Vitals:  Vitals Value Taken Time  BP 129/86 05/11/20 1501  Temp    Pulse 103 05/11/20 1503  Resp 10 05/11/20 1503  SpO2 97 % 05/11/20 1503  Vitals shown include unvalidated device data.  Last Pain:  Vitals:   05/11/20 1500  TempSrc:   PainSc: (P) 0-No pain         Complications: No complications documented.

## 2020-05-11 NOTE — OR Nursing (Signed)
Pt is awake,alert and oriented.Pt and/or family verbalized understanding of poc and discharge instructions. Reviewed admission and on going care with receiving RN. Pt is in NAD at this time and is ready to be transferred to floor. Will con't to monitor until pt is transferred. Belongings on bed with patient Report given to Meredith [name] Pt presently sitting up drinking sprite without difficulty.

## 2020-05-11 NOTE — Interval H&P Note (Signed)
History and Physical Interval Note:  05/11/2020 12:08 PM  Meredith [name]  has presented today for surgery, with the diagnosis of RIGHT BREAST CANCER.  The various methods of treatment have been discussed with the patient and family.   Her husband is here with the patient.  After consideration of risks, benefits and other options for treatment, the patient has consented to  Procedure(s) with comments: RIGHT MASTECTOMY WITH RIGHT AXILLARY SENTINEL LYMPH NODE BIOPSY (Right) - PEC BLOCK BREAST RECONSTRUCTION WITH PLACEMENT OF TISSUE EXPANDER AND ALLODERM (Right) as a surgical intervention.  The patient's history has been reviewed, patient examined, no change in status, stable for surgery.  I have reviewed the patient's chart and labs.  Questions were answered to the patient's satisfaction.     Shann Medal

## 2020-05-11 NOTE — Anesthesia Preprocedure Evaluation (Signed)
Anesthesia Evaluation  Patient identified by MRN, date of birth, ID band Patient awake    Reviewed: Allergy & Precautions, H&P , NPO status , Patient's Chart, lab work & pertinent test results, reviewed documented beta blocker date and time   Airway Mallampati: II  TM Distance: >3 FB Neck ROM: full    Dental no notable dental hx.    Pulmonary neg pulmonary ROS,    Pulmonary exam normal breath sounds clear to auscultation       Cardiovascular Exercise Tolerance: Good negative cardio ROS   Rhythm:regular Rate:Normal     Neuro/Psych negative neurological ROS  negative psych ROS   GI/Hepatic negative GI ROS, Neg liver ROS,   Endo/Other  Hypothyroidism   Renal/GU negative Renal ROS  negative genitourinary   Musculoskeletal   Abdominal   Peds  Hematology negative hematology ROS (+)   Anesthesia Other Findings   Reproductive/Obstetrics negative OB ROS                             Anesthesia Physical Anesthesia Plan  ASA: III  Anesthesia Plan: General   Post-op Pain Management: GA combined w/ Regional for post-op pain   Induction: Intravenous  PONV Risk Score and Plan: 3 and Ondansetron, Dexamethasone, Treatment may vary due to age or medical condition and Midazolam  Airway Management Planned: Oral ETT and LMA  Additional Equipment:   Intra-op Plan:   Post-operative Plan: Extubation in OR  Informed Consent: I have reviewed the patients History and Physical, chart, labs and discussed the procedure including the risks, benefits and alternatives for the proposed anesthesia with the patient or authorized representative who has indicated his/her understanding and acceptance.     Dental Advisory Given  Plan Discussed with: CRNA and Anesthesiologist  Anesthesia Plan Comments: (Discussed both nerve block for pain relief post-op and GA; including NV, sore throat, dental injury, and  pulmonary complications)        Anesthesia Quick Evaluation

## 2020-05-11 NOTE — Anesthesia Procedure Notes (Signed)
Procedure Name: LMA Insertion Date/Time: 05/11/2020 12:38 PM Performed by: Colin Benton, CRNA Pre-anesthesia Checklist: Patient identified, Emergency Drugs available, Suction available and Patient being monitored Patient Re-evaluated:Patient Re-evaluated prior to induction Oxygen Delivery Method: Circle system utilized Preoxygenation: Pre-oxygenation with 100% oxygen Induction Type: IV induction Ventilation: Mask ventilation without difficulty LMA: LMA inserted LMA Size: 4.0 Number of attempts: 1 Placement Confirmation: positive ETCO2 Tube secured with: Tape Dental Injury: Teeth and Oropharynx as per pre-operative assessment

## 2020-05-11 NOTE — Op Note (Signed)
Operative Note   DATE OF OPERATION: 11.22.21  LOCATION: [facility]-observ[facility]  SURGICAL DIVISION: Plastic Surgery  PREOPERATIVE DIAGNOSES:  1. Right breast DCIS  POSTOPERATIVE DIAGNOSES:  same  PROCEDURE:  1. Right breast reconstruction with tissue expander 2. Acellular dermis (Alloderm) to right chest 300 cm2   SURGEON: Irene Limbo MD MBA  ASSISTANT: none  ANESTHESIA:  General.   EBL: 75 ml for entire procedure  COMPLICATIONS: None immediate.   INDICATIONS FOR PROCEDURE:  The patient, Meredith [name], is a 49 y.o. female born on 03-19-1971, is here for immediate prepectoral breast reconstruction following skin reduction pattern mastectomy.   FINDINGS: Natrelle 133S FV-12 T 400 ml tissue expander placed initial fill volume 250 ml air. SN [id number]  DESCRIPTION OF PROCEDURE:  The patient was marked with the patient in the preoperative area to mark sternal notch, chest midline, anterior axillary lines and inframammary folds. Patient was marked for skin reduction mastectomy with most superior portion nipple areola marked on breast meridian. Vertical limbs marked by breast displacement and set at9cm length. The patient was taken to the operating room. SCDs were placed and IV antibiotics were given. The patient's operative site was prepped and draped in a sterile fashion. A time out was performed and all information was confirmed to be correct. In supine position, the lateral limbs for resection marked and area over lower pole preserved as inferiorly based dermal pedicle. Skin de epithelialized in this area.I assisted in mastectomyand sentinel LN sampling withexposure and retraction.  Following completion of mastectomy, the cavity was irrigated with solution containing Ancef, gentamicin, and Betadine. Hemostasis was ensured. A 19 Fr drain was placed in subcutaneous position laterally anda 15 Fr drain placed along inframammary fold. Eachsecured to skin with 2-0 nylon.The tissue  expanderwasprepared on back table prior in insertion. The expander was filled with air to 250 ml air.Perforated acellular dermis was draped over anterior surface expander. The ADM was then secured to itself over posterior surface of expander with 4-0 chromic. Redundant folds acellular dermis excised so that the ADM lie flat without folds over air filled expander.The expander was secured to medial insertion pectoralis with a 0 vicryl.The lateral tab also secured to pectoralis muscle with 0-vicryl. The ADM was secured to pectoralis muscle and chest wall along inferior border at inframammary foldwith 0 V lock suture.Laterally the mastectomy flap over posterior axillary line was advanced anteriorly and the subcutaneous tissue and superficial fascia was secured to pectoralis muscle and acellular dermis with 0-vicryl. The inferiorly based dermal pedicle was redraped superiorly over expander and acellular dermis and secured to pectoralis with interrupted 0-vicryl. Skin closure completedwith 3-0 vicryl in fascial layer and 4-0 vicryl in dermis. Skin closure completed with 4-0 monocryl subcuticular and tissue adhesive.  Tegaderms applied over skin flaps. Dry dressing and breast binder applied.The patient was allowed to wakefromanesthesia, extubatedand taken to the recovery room in satisfactory condition.  SPECIMENS: none  DRAINS: 19 and 15 Fr JP in right breast reconstruction

## 2020-05-11 NOTE — Anesthesia Postprocedure Evaluation (Signed)
Anesthesia Post Note  Patient: Meredith [name]  Procedure(s) Performed: RIGHT MASTECTOMY (Right Breast) AXILLARY SENTINEL LYMPH NODE BIOPSY (Right Axilla) BREAST RECONSTRUCTION WITH PLACEMENT OF TISSUE EXPANDER AND ALLODERM (Right Breast)     Patient location during evaluation: PACU Anesthesia Type: General Level of consciousness: sedated Pain management: pain level controlled Vital Signs Assessment: post-procedure vital signs reviewed and stable Respiratory status: spontaneous breathing and respiratory function stable Cardiovascular status: stable Postop Assessment: no apparent nausea or vomiting Anesthetic complications: no   No complications documented.  Last Vitals:  Vitals:   05/11/20 1530 05/11/20 1545  BP: 120/81 124/79  Pulse: (!) 102 98  Resp: 16 19  Temp:    SpO2: 94% 100%    Last Pain:  Vitals:   05/11/20 1545  TempSrc:   PainSc: 4                  Andren Bethea DANIEL

## 2020-05-11 NOTE — Op Note (Signed)
05/11/2020  1:48 PM  PATIENT:  Meredith [name], 49 y.o., female, MRN: [id number]  PREOP DIAGNOSIS:  RIGHT BREAST CANCER  POSTOP DIAGNOSIS:   Right breast cancer, 6 o'clock position (inferior half of breast) (Tis, N0)  PROCEDURE:   Procedure(s):  RIGHT MASTECTOMY WITH RIGHT AXILLARY SENTINEL LYMPH NODE BIOPSY (deep sentinel lymph node biopsy) - Lucia Gaskins, BREAST RECONSTRUCTION WITH PLACEMENT OF TISSUE EXPANDER AND ALLODERM - Thimmappa  SURGEON:   Alphonsa Overall, M.D.  ASSISTANT:   B. Iran Planas, M.D.  ANESTHESIA:  General  Anesthesiologist: Janeece Riggers, MD; Duane Boston, MD CRNA: Rande Brunt, CRNA; Everlean Cherry A, CRNA  General  ASA:  3  EBL:  <75  ml  BLOOD ADMINISTERED: none  DRAINS: per Dr.Thimmappa  LOCAL MEDICATIONS USED:   right pectoral block  SPECIMEN:   Right breast (suture marks lateral), right axillary sentinel lymph node (counts - 900, background - 30)  COUNTS CORRECT:  YES  INDICATIONS FOR PROCEDURE:  Meredith [name] is a 49 y.o. (DOB: [birth year]) white female whose primary care physician is [name], Shyrl Numbers, MD and comes for right mastectomy.   She was seen at the [facility] with Drs. Remus Blake for DCIS of her right breast.    The MRI of her right breast showed more widespread disease than originally imaged, measuring about 7.8 cm 6.8 cm x 3.2 cm.  Two MRI guided biopsies both showed DCIS.  She had decided on right mastectomy with reconstruction.   The indications and risks of the surgery were explained to the patient.  The risks include, but are not limited to, infection, bleeding, and nerve injury.   In the holding area, her right areola was injected with 1 millicurie of Technitium Sulfur Colloid.  OPERATIVE NOTE;  The patient was taken to OR room # 2 at [facility] where she underwent a general anesthesia  supervised by Anesthesiologist: Janeece Riggers, MD; Duane Boston, MD CRNA: Rande Brunt, CRNA; Colin Benton, CRNA. Both her  breasts and axilla were prepped with ChloraPrep and sterilely draped.    A time-out and the surgical check list was reviewed.    Dr. Iran Planas drew a triangular area including the right nipple as the skin excision.  I developed skin flaps medially to the lateral edge of the sternum, inferiorly to the investing fascia of the rectus abdominus muscle, laterally to the anterior edge of the latissimus dorsi muscle, and superiorly to about 2 finger breaths below the clavicle.  The breast was reflected off the pectoralis muscle from medial to lateral.  The lateral attachments in the right axilla were divided and the breast removed.  A long suture was placed on the lateral aspect of the breast.   I dissected into the right axilla and found a deep sentinel lymph node.  The node had counts of 900 with a background count of 30.  I had not injected methylene blue.  This was sent as a separate specimen.   At this point, Dr. Iran Planas took over the case to proceed with Ms. Polly Cobia reconstruction.  She will dictate from the point of the operation.  Alphonsa Overall, MD, [facility] Pager: [phone] Office phone:  [phone]

## 2020-05-11 NOTE — Interval H&P Note (Signed)
History and Physical Interval Note:  05/11/2020 10:55 AM  Meredith [name]  has presented today for surgery, with the diagnosis of RIGHT BREAST CANCER.  The various methods of treatment have been discussed with the patient and family. After consideration of risks, benefits and other options for treatment, the patient has consented to  right breast reconstruction with tissue expander acellular dermis as a surgical intervention.  The patient's history has been reviewed, patient examined, no change in status, stable for surgery.  I have reviewed the patient's chart and labs.  Questions were answered to the patient's satisfaction.     Arnoldo Hooker Sevanna Ballengee

## 2020-05-11 NOTE — Anesthesia Procedure Notes (Signed)
Anesthesia Regional Block: Pectoralis block   Pre-Anesthetic Checklist: ,, timeout performed, Correct Patient, Correct Site, Correct Laterality, Correct Procedure, Correct Position, site marked, Risks and benefits discussed,  Surgical consent,  Pre-op evaluation,  At surgeon's request and post-op pain management  Laterality: Right  Prep: chloraprep       Needles:  Injection technique: Single-shot  Needle Type: Echogenic Stimulator Needle     Needle Length: 5cm  Needle Gauge: 22     Additional Needles:   Procedures:, nerve stimulator,,, ultrasound used (permanent image in chart),,,,   Nerve Stimulator or Paresthesia:  Response: 0.45 mA,   Additional Responses:   Narrative:  Start time: 05/11/2020 12:00 PM End time: 05/11/2020 12:07 PM Injection made incrementally with aspirations every 5 mL.  Performed by: Personally  Anesthesiologist: Janeece Riggers, MD  Additional Notes: Functioning IV was confirmed and monitors were applied.  A 25mm 22ga Arrow echogenic stimulator needle was used. Sterile prep and drape,hand hygiene and sterile gloves were used. Ultrasound guidance: relevant anatomy identified, needle position confirmed, local anesthetic spread visualized around nerve(s)., vascular puncture avoided.  Image printed for medical record. Negative aspiration and negative test dose prior to incremental administration of local anesthetic. The patient tolerated the procedure well.

## 2020-05-12 ENCOUNTER — Encounter (HOSPITAL_COMMUNITY): Payer: Self-pay | Admitting: Surgery

## 2020-05-12 DIAGNOSIS — D0511 Intraductal carcinoma in situ of right breast: Secondary | ICD-10-CM | POA: Diagnosis not present

## 2020-05-12 NOTE — Progress Notes (Signed)
Patient c/o burning at left lower back. Reports telemetry electrode removed earlier. Site assessed. Small skin tear noted. Small  Foam dressing 2x2 size applied for comfort. Other electrodes removed from left and right shoulder and lower left back. Adhesive remover pads used. Sites noted to be sl reddened. Left lower back site stinging. Non adherent gauze applied for comfort. Extra dressing supplies sent with patient as needed.

## 2020-05-12 NOTE — Progress Notes (Signed)
Discharge instructions reviewed with patient and husband, including drain care and documentation. Patient able to answer teach back questions re: f/u appointments, pain management, and when to call the doctor. Denies other questions. Printed AVS and dressing supplies given to patient.

## 2020-05-12 NOTE — Discharge Summary (Signed)
Physician Discharge Summary  Patient ID: Meredith [name] MRN: [id number] DOB/AGE: [birth year] 49 y.o.  Admit date: 05/11/2020 Discharge date: 05/12/2020  Admission Diagnoses: DCIS right breast  Discharge Diagnoses:  Active Problems:   DCIS (ductal carcinoma in situ) of breast   Discharged Condition: stable  Hospital Course: Post operatively tolerated diet and ambulatory with minimal assist. Pain controlled with oral medication. Instructed on drain care and bathing compression.  Treatments: surgery: right mastectomy sentinel node right breast reconstruction with tissue expander acellular dermis 11.22.21  Discharge Exam: Blood pressure 90/61, pulse 73, temperature 98.1 F (36.7 C), temperature source Oral, resp. rate 18, last menstrual period 05/18/2011, SpO2 96 %. Incision/Wound: chest soft incisions dry intact drains serosanguinous  Disposition: Discharge disposition: 01-Home or Self Care       Discharge Instructions    Call MD for:  redness, tenderness, or signs of infection (pain, swelling, bleeding, redness, odor or green/yellow discharge around incision site)   Complete by: As directed    Call MD for:  temperature >100.5   Complete by: As directed    Discharge instructions   Complete by: As directed    Ok to remove dressings and shower am 11.24.21. Soap and water ok, pat Tegaderms dry. Do not remove Tegaderms. No creams or ointments over incisions. Do not let drains dangle in shower, attach to lanyard or similar.Strip and record drains twice daily and bring log to clinic visit.  Breast binder or soft compression bra all other times.  Ok to raise arms above shoulders for bathing and dressing.  No house yard work or exercise until cleared by MD.   Patient received all Rx preop. Recommend ibuprofen with meals to aid with pain control. Recommend Miralax or Dulcolax as needed for constipation. Ice packs to chest for comfort as desired.   Driving Restrictions   Complete by:  As directed    No driving for 2 weeks then no driving if taking narcotics   Lifting restrictions   Complete by: As directed    No lifting > 5 lbs until cleared by MD   Resume previous diet   Complete by: As directed      Allergies as of 05/12/2020      Reactions   Demerol [meperidine] Nausea And Vomiting   Phenobarbital Other (See Comments)   As a baby, made her hyperactive      Medication List    TAKE these medications   cetirizine 10 MG tablet Commonly known as: ZYRTEC Take 10 mg by mouth daily as needed for allergies.   levothyroxine 100 MCG tablet Commonly known as: SYNTHROID Take 100 mcg by mouth daily before breakfast.   Mirena (52 MG) 20 MCG/24HR IUD Generic drug: levonorgestrel 1 each by Intrauterine route once.       Follow-up Information    Irene Limbo, MD In 1 week.   Specialty: Plastic Surgery Why: as scheduled Contact information: [address] [place] [address] [phone]        Alphonsa Overall, MD. Schedule an appointment as soon as possible for a visit in 2 weeks.   Specialty: General Surgery Contact information: [address] [place] [address] [phone]               Signed: Irene Limbo 05/12/2020, 6:52 AM

## 2020-05-12 NOTE — Progress Notes (Signed)
[facility] Office:  [phone] General Surgery Progress Note   LOS: 0 days  POD -  1 Day Post-Op  Assessment and Plan: 1.  RIGHT MASTECTOMY, AXILLARY SENTINEL LYMPH NODE BIOPSY - 05/11/2020 - Johnelle Tafolla  BREAST RECONSTRUCTION WITH PLACEMENT OF TISSUE EXPANDER AND ALLODERM - Thimmappa  Looks good.  Ready to go home.  Dr. Iran Planas has done discharge.  She is to see me in a couple of weeks. 2.  Bone cyst of manubrium 3. Thyroid replacement   Active Problems:   DCIS (ductal carcinoma in situ) of breast  Subjective:  Still sore, but not as sore as last PM.  Dr. Iran Planas has already been in this AM.  Objective:   Vitals:   05/12/20 0149 05/12/20 0530  BP: 101/61 90/61  Pulse: 81 73  Resp: 20 18  Temp: 98.1 F (36.7 C) 98.1 F (36.7 C)  SpO2: 96% 96%     Intake/Output from previous day:  11/22 0701 - 11/23 0700 In: 1607 [P.O.:420; I.V.:525; IV Piggyback:150] Out: 1115 [Urine:1000; Drains:40; Blood:75]  Intake/Output this shift:  No intake/output data recorded.   Physical Exam:   General: WN WF who is alert and oriented.    HEENT: Normal. Pupils equal.   Wound: Skin looks good over reconstruction.   Lab Results:   No results for input(s): WBC, HGB, HCT, PLT in the last 72 hours.  BMET  No results for input(s): NA, K, CL, CO2, GLUCOSE, BUN, CREATININE, CALCIUM in the last 72 hours.  PT/INR  No results for input(s): LABPROT, INR in the last 72 hours.  ABG  No results for input(s): PHART, HCO3 in the last 72 hours.  Invalid input(s): PCO2, PO2   Studies/Results:  NM Sentinel Node Inj-No Rpt (Breast)  Result Date: 05/11/2020 Sulfur colloid was injected by the nuclear medicine technologist for melanoma sentinel node.     Anti-infectives:   Anti-infectives (From admission, onward)   Start     Dose/Rate Route Frequency Ordered Stop   05/11/20 1830  ceFAZolin (ANCEF) IVPB 1 g/50 mL premix        1 g 100 mL/hr over 30 Minutes Intravenous Every 8 hours  05/11/20 1620 05/12/20 1829   05/11/20 1306  ceFAZolin 1 g / gentamicin 80 mg in NS 500 mL surgical irrigation  Status:  Discontinued          As needed 05/11/20 1308 05/11/20 1456   05/11/20 1215  ceFAZolin 1 g / gentamicin 80 mg in NS 500 mL surgical irrigation  Status:  Discontinued         Irrigation  Once 05/11/20 1213 05/11/20 1616   05/11/20 1100  ceFAZolin (ANCEF) IVPB 2g/100 mL premix        2 g 200 mL/hr over 30 Minutes Intravenous On call to O.R. 05/11/20 1049 05/11/20 [place], MD, [facility] Office: [phone] 05/12/2020

## 2020-05-13 LAB — SURGICAL PATHOLOGY

## 2020-05-17 NOTE — Progress Notes (Signed)
[name] Care Team: Wayland Salinas, MD as PCP - General (Family Medicine) Rockwell Germany, RN as Oncology Nurse Navigator Mauro Kaufmann, RN as Oncology Nurse Navigator Alphonsa Overall, MD as Consulting Physician (General Surgery) Nicholas Lose, MD as Consulting Physician (Hematology and Oncology)  DIAGNOSIS:    ICD-10-CM   1. 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 Biopsy showed DCIS, high grade, ER+ 95%, PR+ 30%.   02/19/2020 Cancer Staging   Staging form: Meredith, AJCC 8th Edition - Clinical stage from 02/19/2020: Stage 0 (cTis (DCIS), cN0, cM0, ER+, PR+) - Signed by Nicholas Lose, MD on 02/19/2020   02/25/2020 Genetic Testing   No pathogenic variants detected in [facility] gene panel.  The Meredith/GYN Cancer Panel offered by [facility] includes sequencing and rearrangement analysis for the following 20 genes:  ATM, BARD1, BRCA1, BRCA2, BRIP1, CDH1, CHEK2, EPCAM (deletion/duplication analysis only), MLH1, MSH2, MSH6, NBN, NF1, PALB2, PMS2, PTEN, RAD51C, RAD51D, STK11 and TP53.  The report date is February 25, 2020.    05/11/2020 Surgery   Right mastectomy with reconstruction Donne Hazel & Thimmappa): DCIS, high grade, involving the posterior margin, 1 right axillary lymph node negative for carcinoma.      CHIEF COMPLIANT: Follow-up s/p right mastectomy   INTERVAL HISTORY: Meredith [name] is a 49 y.o. with above-mentioned history of DCIS of the right Meredith. She underwent a right mastectomy with reconstruction with Dr. Donne Hazel and Dr. Iran Planas on 05/11/20 for which pathology showed DCIS, high grade, involving the posterior margin, 1 right axillary lymph node negative for carcinoma. She presents to the clinic today for initial evaluation and discussion of treatment options.    [name] is very sore from recent surgery.  ALLERGIES:  is allergic to demerol [meperidine] and phenobarbital.  MEDICATIONS:  Current Outpatient Medications  Medication Sig Dispense Refill  . cetirizine (ZYRTEC) 10 MG tablet Take 10 mg by mouth daily as needed for allergies.     Marland Kitchen levonorgestrel (MIRENA, 52 MG,) 20 MCG/24HR IUD 1 each by Intrauterine route once.     Marland Kitchen levothyroxine (SYNTHROID) 100 MCG tablet Take 100 mcg by mouth daily before breakfast.     No current facility-administered medications for this visit.    PHYSICAL EXAMINATION: ECOG PERFORMANCE STATUS: 1 - Symptomatic but completely ambulatory  Vitals:   05/18/20 1413  BP: 107/72  Pulse: 77  Resp: 18  Temp: 98.1 F (36.7 C)  SpO2: 100%   Filed Weights   05/18/20 1413  Weight: 152 lb 6.4 oz (69.1 kg)    LABORATORY DATA:  I have reviewed the data as listed CMP Latest Ref Rng & Units 02/19/2020  Glucose 70 - 99 mg/dL 87  BUN 6 - 20 mg/dL 14  Creatinine 0.44 - 1.00 mg/dL 0.83  Sodium 135 - 145 mmol/L 140  Potassium 3.5 - 5.1 mmol/L 4.6  Chloride 98 - 111 mmol/L 105  CO2 22 - 32 mmol/L 28  Calcium 8.9 - 10.3 mg/dL 10.5(H)  Total Protein 6.5 - 8.1 g/dL 7.5  Total Bilirubin 0.3 - 1.2 mg/dL 0.7  Alkaline Phos 38 - 126 U/L 74  AST 15 - 41 U/L 22  ALT 0 - 44 U/L 16    Lab Results  Component Value Date   WBC 4.5 05/07/2020   HGB 13.2 05/07/2020  HCT 40.9 05/07/2020   MCV 92.3 05/07/2020   PLT 250 05/07/2020   NEUTROABS 2.2 02/19/2020    ASSESSMENT & PLAN:  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 Biopsy showed DCIS, high grade, ER+ 95%, PR+ 30%.  Tis NX stage 0  05/11/2020: Rt Mastectomy: HG DCIS spans 7 cm. Focal Pos Posterior margin. ER/ PR Pos Treatment Plan: Adjuvant Tamoxifen (we will start her at 10 mg daily for 1 month starting 06/03/2020.  If she tolerates it well then we will increase it to 20 mg  daily)  Meredith cancer surveillance: She will need a Meredith MRI annually in February.  She will a mammogram of the left Meredith in August 2022 RTC in 3 months for SCP visit  No orders of the defined types were placed in this encounter.  The [name] has a good understanding of the overall plan. she agrees with it. she will call with any problems that may develop before the next visit here.  Total time spent: 30 mins including face to face time and time spent for planning, charting and coordination of care  Nicholas Lose, MD 05/18/2020  I, Cloyde Reams Dorshimer, am acting as scribe for Dr. Nicholas Lose.  I have reviewed the above documentation for accuracy and completeness, and I agree with the above.

## 2020-05-17 NOTE — Assessment & Plan Note (Signed)
02/14/2020:Screening mammogram detected right breast calcifications spanning 1.4cm. Biopsy showed DCIS, high grade, ER+ 95%, PR+ 30%.  Tis NX stage 0  05/11/2020: Rt Mastectomy: HG DCIS spans 7 cm. Focal Pos Posterior margin. ER/ PR Pos Treatment Plan: Adjuvant Tamoxifen    RTC in 3 months for SCP visit

## 2020-05-18 ENCOUNTER — Inpatient Hospital Stay: Payer: BC Managed Care – PPO | Attending: Hematology and Oncology | Admitting: Hematology and Oncology

## 2020-05-18 ENCOUNTER — Encounter: Payer: Self-pay | Admitting: *Deleted

## 2020-05-18 ENCOUNTER — Other Ambulatory Visit: Payer: Self-pay

## 2020-05-18 DIAGNOSIS — Z170 Estrogen receptor positive status [ER+]: Secondary | ICD-10-CM | POA: Diagnosis not present

## 2020-05-18 DIAGNOSIS — D0511 Intraductal carcinoma in situ of right breast: Secondary | ICD-10-CM

## 2020-05-18 MED ORDER — TAMOXIFEN CITRATE 10 MG PO TABS: 10.0000 mg | ORAL_TABLET | Freq: Two times a day (BID) | ORAL | 3 refills | Status: DC

## 2020-05-20 ENCOUNTER — Telehealth: Payer: Self-pay | Admitting: Hematology and Oncology

## 2020-05-20 NOTE — Telephone Encounter (Signed)
Scheduled per 11/29 los. Called pt and left a msg

## 2020-06-10 ENCOUNTER — Other Ambulatory Visit: Payer: Self-pay

## 2020-06-10 ENCOUNTER — Ambulatory Visit: Payer: BC Managed Care – PPO | Attending: Plastic Surgery

## 2020-06-10 DIAGNOSIS — M25511 Pain in right shoulder: Secondary | ICD-10-CM

## 2020-06-10 DIAGNOSIS — Z483 Aftercare following surgery for neoplasm: Secondary | ICD-10-CM

## 2020-06-10 DIAGNOSIS — R293 Abnormal posture: Secondary | ICD-10-CM | POA: Diagnosis present

## 2020-06-10 DIAGNOSIS — D0511 Intraductal carcinoma in situ of right breast: Secondary | ICD-10-CM | POA: Diagnosis present

## 2020-06-10 NOTE — Therapy (Signed)
[place], [place], [address] Phone: [phone]   Fax:  [phone]  Physical Therapy Evaluation  Patient Details  Name: Meredith [name] MRN: [id number] Date of Birth: 07-20-1970 Referring Provider (PT): Dr. Iran Planas   Encounter Date: 06/10/2020   PT End of Session - 06/10/20 1703    Visit Number 1    Number of Visits 12    Date for PT Re-Evaluation 07/22/20    PT Start Time 1604    PT Stop Time 1659    PT Time Calculation (min) 55 min    Activity Tolerance Patient tolerated treatment well    Behavior During Therapy [facility] for tasks assessed/performed           Past Medical History:  Diagnosis Date  . Breast cancer ([place])   . Family history of prostate cancer 02/20/2020  . Hypothyroidism     Past Surgical History:  Procedure Laterality Date  . AXILLARY SENTINEL NODE BIOPSY Right 05/11/2020   Procedure: AXILLARY SENTINEL LYMPH NODE BIOPSY;  Surgeon: Alphonsa Overall, MD;  Location: [facility];  Service: General;  Laterality: Right;  . BREAST RECONSTRUCTION WITH PLACEMENT OF TISSUE EXPANDER AND ALLODERM Right 05/11/2020   Procedure: BREAST RECONSTRUCTION WITH PLACEMENT OF TISSUE EXPANDER AND ALLODERM;  Surgeon: Irene Limbo, MD;  Location: [facility];  Service: Plastics;  Laterality: Right;  . CESAREAN SECTION    . MASTECTOMY W/ SENTINEL NODE BIOPSY Right 05/11/2020   Procedure: RIGHT MASTECTOMY;  Surgeon: Alphonsa Overall, MD;  Location: [facility] [place];  Service: General;  Laterality: Right;  PEC BLOCK  . MOHS SURGERY     basal cell    There were no vitals filed for this visit.    Subjective Assessment - 06/10/20 1603    Subjective Pt had right mastectomy with SLNB on 05/11/20 with expander placed for high grade DCIS.  She had it filled  last week.  She does not have to do chemo or radiation.  She is taking Tamoxifen.  Pt is feeling some tightness in axillary region, and Right shoulder blade area gets sore with  radiation around to the axilla.  prox biceps and medial arm is also sore    Pertinent History High grade DCIS but had not become invasive.    Currently in Pain? Yes    Pain Score 4     Pain Location Chest    Pain Orientation Right    Pain Descriptors / Indicators Tightness;Aching   pinching under shoulder blade   Pain Radiating Towards shoulder blade pain radiating around toward chest    Pain Frequency Intermittent    Aggravating Factors  reaching,    Effect of Pain on Daily Activities limits what she is doing, reaching,              [facility] PT Assessment - 06/10/20 0001      Assessment   Medical Diagnosis Right mastectomy    Referring Provider (PT) Dr. Iran Planas    Onset Date/Surgical Date 05/11/20    Hand Dominance Right    Prior Therapy no      Precautions   Precaution Comments lympedema risk      Restrictions   Weight Bearing Restrictions No    Other Position/Activity Restrictions no      Balance Screen   Has the patient fallen in the past 6 months No    Has the patient had a decrease in activity level because of a fear of falling?  Yes  Is the patient reluctant to leave their home because of a fear of falling?  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    Leisure use to work out but not since surgery      Cognition   Overall Cognitive Status Within Functional Limits for tasks assessed      Observation/Other Assessments   Observations WNL      AROM   Right Shoulder Extension 55 Degrees    Right Shoulder Flexion 134 Degrees    Right Shoulder ABduction 113 Degrees    Right Shoulder Internal Rotation 73 Degrees    Right Shoulder External Rotation 87 Degrees    Left Shoulder Extension 54 Degrees     Left Shoulder Flexion 159 Degrees    Left Shoulder ABduction 160 Degrees    Left Shoulder Internal Rotation 70 Degrees    Left Shoulder External Rotation 90 Degrees             LYMPHEDEMA/ONCOLOGY QUESTIONNAIRE - 06/10/20 0001      Surgeries   Mastectomy Date 05/11/20    Number Lymph Nodes Removed 1      Treatment   Active Chemotherapy Treatment No    Past Chemotherapy Treatment No    Active Radiation Treatment No    Past Radiation Treatment No    Current Hormone Treatment Yes    Drug Name Tamoxifen    Past Hormone Therapy No      What other symptoms do you have   Are you Having Heaviness or Tightness Yes    Are you having Pain Yes    Are you having pitting edema No    Is it Hard or Difficult finding clothes that fit No    Do you have infections No    Is there Decreased scar mobility Yes    Stemmer Sign No      Right Upper Extremity Lymphedema   At Axilla  32.2 cm    15 cm Proximal to Olecranon Process 30.4 cm    10 cm Proximal to Olecranon Process 29.1 cm    Olecranon Process 24.8 cm    15 cm Proximal to Ulnar Styloid Process 23.4 cm    10 cm Proximal to Ulnar Styloid Process 20.2 cm    Just Proximal to Ulnar Styloid Process 15 cm    Across Hand at [facility] 18.3 cm    At [place] of 2nd Digit 6.1 cm      Left Upper Extremity Lymphedema   At Axilla  32.7 cm    15 cm Proximal to Olecranon Process 31.7 cm    10 cm Proximal to Olecranon Process 29.9 cm    Olecranon Process 25.2 cm    15 cm Proximal to Ulnar Styloid Process 23.3 cm    10 cm Proximal to Ulnar Styloid Process 20.3 cm    Just Proximal to Ulnar Styloid Process 15 cm    Across Hand at [facility] 18.7 cm    At [place] of 2nd Digit 6 cm                 Quick Dash - 06/10/20 0001    Open a tight or new  jar Moderate difficulty    Do heavy household chores (wash walls, wash floors) No difficulty    Carry a shopping bag or briefcase Mild difficulty    Wash your back Moderate difficulty     Use a knife to cut food No difficulty    Recreational activities in which you take some force or impact through your arm, shoulder, or hand (golf, hammering, tennis) Severe difficulty    During the past week, to what extent has your arm, shoulder or hand problem interfered with your normal social activities with family, friends, neighbors, or groups? Not at all    During the past week, to what extent has your arm, shoulder or hand problem limited your work or other regular daily activities Not at all    Arm, shoulder, or hand pain. Mild    Tingling (pins and needles) in your arm, shoulder, or hand Mild    Difficulty Sleeping Mild difficulty    DASH Score 25 %            Objective measurements completed on examination: See above findings.               PT Education - 06/10/20 1701    Education Details Pt was educated in 4 post op exercises supine and standing AA shoulder flexion and ER, standing wall climbs, and scapular retraction.  Pt was able to return demonstrate all. We all discussed precautions for lymphedema and her risk, and set pt up for ABC class.    Person(s) Educated Patient    Methods Explanation;Demonstration;Handout    Comprehension Verbalized understanding;Returned demonstration            PT Short Term Goals - 06/10/20 1713      PT SHORT TERM GOAL #1   Title Pt will be independent with a HEP for right shoulder ROM and strengthening    Time 3    Period Weeks    Status New    Target Date 06/30/20             PT Long Term Goals - 06/10/20 1714      PT LONG TERM GOAL #1   Title Pt will note decreased pain and tightness right shoulder and trunk by atleast 50%    Time 6    Period Weeks    Status New    Target Date 07/22/20      PT LONG TERM GOAL #2   Title Pt will have Right shoulder abd atleast 135, and flexion 150 for improved reaching ability    Baseline flex 134, abd 113    Time 3    Period Weeks    Status New    Target Date 07/01/20       PT LONG TERM GOAL #3   Title Pt will have full Right shoulder AROM WNL compared to left for performance of normal home activities    Time 6    Period Weeks    Status New    Target Date 07/22/20      PT LONG TERM GOAL #4   Title Quick dash score no greater than 10%    Time 6    Period Weeks    Status New    Target Date 07/22/20      PT LONG TERM GOAL #5   Title Pt will have no visible or palpable cording    Time 6    Period Weeks    Status New    Target Date 07/22/20  Plan - 06/10/20 1704    Clinical Impression Statement Pt is s/p Right DCIS with Right mastectomy and immediate tissue expanders.  She will likely have expanders removed and implants placed sometime in mid February.  She presents today with limitations in right shoulder AROM and with some cording present at the axilla through the medial arm to forearm with tenderness.  Incisions are healing well with slight scabbing at the distal breast incision.  She will take part in ABC class and has been instructed in 4 post op exercises    Stability/Clinical Decision Making Stable/Uncomplicated    Clinical Decision Making Low    Rehab Potential Excellent    PT Frequency 2x / week    PT Duration 6 weeks    PT Treatment/Interventions Therapeutic exercise;Manual techniques;Orthotic Fit/Training;Patient/family education;Manual lymph drainage;Passive range of motion;Taping;Neuromuscular re-education    PT Next Visit Plan Review HEP, PROM right shoulder, MFR and STW to cording areas, review precautions, TG soft?    PT Home Exercise Plan 4 post op exercises    Consulted and Agree with Plan of Care Patient           Patient will benefit from skilled therapeutic intervention in order to improve the following deficits and impairments:     Visit Diagnosis: Ductal carcinoma in situ of right breast  Aftercare following surgery for neoplasm  Acute pain of right shoulder  Abnormal posture     Problem  List Patient Active Problem List   Diagnosis Date Noted  . DCIS (ductal carcinoma in situ) of breast 05/11/2020  . Genetic testing 02/26/2020  . Family history of prostate cancer 02/20/2020  . Ductal carcinoma in situ (DCIS) of right breast 02/14/2020    Claris Pong 06/10/2020, 5:25 PM  [place], [place], [address] Phone: [phone]   Fax:  [phone]  Name: Meredith [name] MRN: [id number] Date of Birth: 1971/01/02 Cheral Almas, PT 06/10/20 5:29 PM

## 2020-06-10 NOTE — Patient Instructions (Signed)
Patient was instructed today in a home exercise program today for post op shoulder range of motion. These included active assist shoulder flexion in sitting, scapular retraction, wall walking with shoulder abduction, and hands behind head external rotation.  She was encouraged to do these twice a day, holding 3 seconds and repeating 5 times when permitted by her physician. °  °

## 2020-07-22 NOTE — H&P (Signed)
Subjective:     Patient ID: Meredith [name] is a 50 y.o. female.  HPI  10 weeks post op.Scheduled for next stage breast reconstruction this month.  Presented following screening MMG with right breast calcifications spanning 1.4 cm. Biopsy labeled right breast LIQ demonstrated DCIS high grade ER/PR+. MRI showed NME throughout the inferior right breast, 7.8 cm x 6.8 x 3.2 cm. There were no defined masses, no abnormal appearing LN. 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 Two additional biopsies labeled right breast LOQ anterior extent and right breast posterior extent both showed DCIS with focal necrosis. Given extent of disease mastectomy recommended.  Final pathology high grade DCIS spanning 7 cm, focally involves the posterior margin, 0/1 SLN. Tamoxifen started.  Genetics negative  Prior 36 B/C, would not mind smaller. Right mastectomy 634 g  Patient is realtor. Lives with spouse who accompanies her today. One son in college, daughter is family practice NP in [place].   Review of Systems     Objective:   Physical Exam Cardiovascular:     Rate and Rhythm: Normal rate and regular rhythm.     Heart sounds: Normal heart sounds.  Pulmonary:     Effort: Pulmonary effort is normal.     Breath sounds: Normal breath sounds.    Chest: right chest expanded rippling noted superior pole, depression contour right upper outer chest Left breast no mases SN to nipple L 27 BW L 18 Nipple to IMF L 9   Assessment:     Right breast DCIS S/p right SRM, SLN, prepectoral TE/ADM (Alloderm) reconstruction    Plan:     Hold tamoxifen week prior to surgery.  Plan removal right TE and placement implant left breast mastopexy.  Reviewed saline vs silicone, smooth v textured, shaped v round. As in prepectoral position I do recommend HCG or capacity filled silicone implants to reduce risk visible rippling. Reviewed MRI or USsurveillance for  rupture with silicone implants.Reviewed examples for 4th generation, capacity filled 4th generation, and HCG implants vs saline implants.Discussed risk ALCL with textured devices.She will consider. Reviewed implants are not permanent devices, may require additional surgery and reviewed risk rupture. She has elected for silicone, plan smooth round.  Discussed purpose fat grafting to thicken flaps, reduce visible rippling. Reviewed variable take graft, may need to repeat, fat necrosis that presents as masses, abdominal incisions, pain need for compression. She declines this.  Over left breast reviewed anchor type scars, drain placement. Diminished sensation nipple and breast skin, risk of nipple loss, wound healing problems, asymmetry, incidental carcinoma, changes with wt gain/loss, aging, unacceptable cosmetic appearance reviewed.  Additional risks including infection, blood clots in legs or lungs, seroma, hematoma, damage to adjacent structures reviewed. Goal is to be smaller volume that natural breast. Reviewed implant sizing based in part on chest width. Cannot assure her cup size.  Completed physician patient checklist for Natrelle Inspira implant placement, reconstruction.  Rx for Bactrim given. Has pain medication and muscle relaxant at home.  Natrelle 133S FV-12 T 400 ml tissue expander placed  fill volume 360 ml saline.

## 2020-07-29 ENCOUNTER — Telehealth: Payer: Self-pay | Admitting: Adult Health

## 2020-07-29 NOTE — Telephone Encounter (Signed)
Rescheduled SCP visit per incoming call from pt. Pt confirmed new appt date/time.

## 2020-08-03 ENCOUNTER — Other Ambulatory Visit: Payer: Self-pay

## 2020-08-03 ENCOUNTER — Encounter (HOSPITAL_BASED_OUTPATIENT_CLINIC_OR_DEPARTMENT_OTHER): Payer: Self-pay | Admitting: Plastic Surgery

## 2020-08-07 ENCOUNTER — Other Ambulatory Visit (HOSPITAL_COMMUNITY)
Admission: RE | Admit: 2020-08-07 | Discharge: 2020-08-07 | Disposition: A | Discharge status: Home / Self Care | Payer: BC Managed Care – PPO | Source: Ambulatory Visit | Attending: Plastic Surgery | Admitting: Plastic Surgery

## 2020-08-07 DIAGNOSIS — Z01812 Encounter for preprocedural laboratory examination: Secondary | ICD-10-CM | POA: Diagnosis present

## 2020-08-07 DIAGNOSIS — Z20822 Contact with and (suspected) exposure to covid-19: Secondary | ICD-10-CM | POA: Insufficient documentation

## 2020-08-07 LAB — SARS CORONAVIRUS 2 (TAT 6-24 HRS): SARS Coronavirus 2: NEGATIVE

## 2020-08-07 NOTE — Progress Notes (Signed)

## 2020-08-11 ENCOUNTER — Ambulatory Visit (HOSPITAL_BASED_OUTPATIENT_CLINIC_OR_DEPARTMENT_OTHER): Payer: BC Managed Care – PPO | Admitting: Anesthesiology

## 2020-08-11 ENCOUNTER — Ambulatory Visit (HOSPITAL_BASED_OUTPATIENT_CLINIC_OR_DEPARTMENT_OTHER)
Admission: RE | Admit: 2020-08-11 | Discharge: 2020-08-11 | Disposition: A | Discharge status: Home / Self Care | Payer: BC Managed Care – PPO | Attending: Plastic Surgery | Admitting: Plastic Surgery

## 2020-08-11 ENCOUNTER — Encounter (HOSPITAL_BASED_OUTPATIENT_CLINIC_OR_DEPARTMENT_OTHER)
Admission: RE | Disposition: A | Discharge status: Home / Self Care | Payer: Self-pay | Source: Home / Self Care | Attending: Plastic Surgery

## 2020-08-11 ENCOUNTER — Encounter (HOSPITAL_BASED_OUTPATIENT_CLINIC_OR_DEPARTMENT_OTHER): Payer: Self-pay | Admitting: Plastic Surgery

## 2020-08-11 ENCOUNTER — Other Ambulatory Visit: Payer: Self-pay

## 2020-08-11 DIAGNOSIS — Z421 Encounter for breast reconstruction following mastectomy: Secondary | ICD-10-CM | POA: Insufficient documentation

## 2020-08-11 DIAGNOSIS — Z9011 Acquired absence of right breast and nipple: Secondary | ICD-10-CM | POA: Insufficient documentation

## 2020-08-11 DIAGNOSIS — Z79899 Other long term (current) drug therapy: Secondary | ICD-10-CM | POA: Diagnosis not present

## 2020-08-11 DIAGNOSIS — Z86000 Personal history of in-situ neoplasm of breast: Secondary | ICD-10-CM | POA: Insufficient documentation

## 2020-08-11 HISTORY — PX: MASTOPEXY: SHX5358

## 2020-08-11 HISTORY — PX: REMOVAL OF TISSUE EXPANDER AND PLACEMENT OF IMPLANT: SHX6457

## 2020-08-11 SURGERY — REMOVAL, TISSUE EXPANDER, BREAST, WITH IMPLANT INSERTION
Anesthesia: General | Site: Breast | Laterality: Right

## 2020-08-11 MED ORDER — SODIUM CHLORIDE 0.9 % IV SOLN
INTRAVENOUS | Status: AC
  Filled 2020-08-11: qty 10

## 2020-08-11 MED ORDER — PROPOFOL 500 MG/50ML IV EMUL
INTRAVENOUS | Status: AC
  Filled 2020-08-11: qty 50

## 2020-08-11 MED ORDER — DEXAMETHASONE SODIUM PHOSPHATE 10 MG/ML IJ SOLN
INTRAMUSCULAR | Status: AC
  Filled 2020-08-11: qty 1

## 2020-08-11 MED ORDER — LIDOCAINE 2% (20 MG/ML) 5 ML SYRINGE
INTRAMUSCULAR | Status: AC
  Filled 2020-08-11: qty 5

## 2020-08-11 MED ORDER — PROPOFOL 10 MG/ML IV BOLUS
INTRAVENOUS | Status: DC | PRN
  Administered 2020-08-11: 200 mg via INTRAVENOUS

## 2020-08-11 MED ORDER — CELECOXIB 200 MG PO CAPS
200.0000 mg | ORAL_CAPSULE | ORAL | Status: AC
  Administered 2020-08-11: 200 mg via ORAL

## 2020-08-11 MED ORDER — FENTANYL CITRATE (PF) 100 MCG/2ML IJ SOLN
INTRAMUSCULAR | Status: AC
  Filled 2020-08-11: qty 2

## 2020-08-11 MED ORDER — FENTANYL CITRATE (PF) 100 MCG/2ML IJ SOLN
25.0000 ug | INTRAMUSCULAR | Status: DC | PRN
  Administered 2020-08-11 (×2): 25 ug via INTRAVENOUS

## 2020-08-11 MED ORDER — SUGAMMADEX SODIUM 200 MG/2ML IV SOLN
INTRAVENOUS | Status: DC | PRN
  Administered 2020-08-11: 150 mg via INTRAVENOUS

## 2020-08-11 MED ORDER — ONDANSETRON HCL 4 MG/2ML IJ SOLN
INTRAMUSCULAR | Status: AC
  Filled 2020-08-11: qty 2

## 2020-08-11 MED ORDER — CEFAZOLIN SODIUM-DEXTROSE 2-4 GM/100ML-% IV SOLN
INTRAVENOUS | Status: AC
  Filled 2020-08-11: qty 100

## 2020-08-11 MED ORDER — OXYCODONE HCL 5 MG PO TABS
ORAL_TABLET | ORAL | Status: AC
  Filled 2020-08-11: qty 1

## 2020-08-11 MED ORDER — ONDANSETRON HCL 4 MG/2ML IJ SOLN
INTRAMUSCULAR | Status: DC | PRN
  Administered 2020-08-11: 4 mg via INTRAVENOUS

## 2020-08-11 MED ORDER — PHENYLEPHRINE 40 MCG/ML (10ML) SYRINGE FOR IV PUSH (FOR BLOOD PRESSURE SUPPORT)
PREFILLED_SYRINGE | INTRAVENOUS | Status: AC
  Filled 2020-08-11: qty 10

## 2020-08-11 MED ORDER — OXYCODONE HCL 5 MG PO TABS
5.0000 mg | ORAL_TABLET | Freq: Once | ORAL | Status: AC | PRN
  Administered 2020-08-11: 5 mg via ORAL

## 2020-08-11 MED ORDER — ONDANSETRON HCL 4 MG/2ML IJ SOLN: 4.0000 mg | Freq: Once | INTRAMUSCULAR | Status: DC | PRN

## 2020-08-11 MED ORDER — ACETAMINOPHEN 500 MG PO TABS
ORAL_TABLET | ORAL | Status: AC
  Filled 2020-08-11: qty 2

## 2020-08-11 MED ORDER — FENTANYL CITRATE (PF) 100 MCG/2ML IJ SOLN
INTRAMUSCULAR | Status: DC | PRN
  Administered 2020-08-11: 25 ug via INTRAVENOUS
  Administered 2020-08-11: 50 ug via INTRAVENOUS
  Administered 2020-08-11: 25 ug via INTRAVENOUS

## 2020-08-11 MED ORDER — ROCURONIUM BROMIDE 100 MG/10ML IV SOLN
INTRAVENOUS | Status: DC | PRN
  Administered 2020-08-11: 50 mg via INTRAVENOUS

## 2020-08-11 MED ORDER — BUPIVACAINE HCL (PF) 0.5 % IJ SOLN
INTRAMUSCULAR | Status: AC
  Filled 2020-08-11: qty 30

## 2020-08-11 MED ORDER — OXYCODONE HCL 5 MG/5ML PO SOLN: 5.0000 mg | Freq: Once | ORAL | Status: AC | PRN

## 2020-08-11 MED ORDER — AMISULPRIDE (ANTIEMETIC) 5 MG/2ML IV SOLN: 10.0000 mg | Freq: Once | INTRAVENOUS | Status: DC | PRN

## 2020-08-11 MED ORDER — BUPIVACAINE HCL (PF) 0.5 % IJ SOLN
INTRAMUSCULAR | Status: DC | PRN
  Administered 2020-08-11: 20 mL

## 2020-08-11 MED ORDER — CEFAZOLIN SODIUM-DEXTROSE 2-4 GM/100ML-% IV SOLN
2.0000 g | INTRAVENOUS | Status: AC
  Administered 2020-08-11: 2 g via INTRAVENOUS

## 2020-08-11 MED ORDER — PROPOFOL 10 MG/ML IV BOLUS
INTRAVENOUS | Status: AC
  Filled 2020-08-11: qty 20

## 2020-08-11 MED ORDER — CELECOXIB 200 MG PO CAPS
ORAL_CAPSULE | ORAL | Status: AC
  Filled 2020-08-11: qty 1

## 2020-08-11 MED ORDER — MIDAZOLAM HCL 5 MG/5ML IJ SOLN
INTRAMUSCULAR | Status: DC | PRN
  Administered 2020-08-11: 2 mg via INTRAVENOUS

## 2020-08-11 MED ORDER — GABAPENTIN 300 MG PO CAPS
ORAL_CAPSULE | ORAL | Status: AC
  Filled 2020-08-11: qty 1

## 2020-08-11 MED ORDER — SODIUM CHLORIDE 0.9 % IV SOLN
INTRAVENOUS | Status: DC | PRN
  Administered 2020-08-11: 500 mL

## 2020-08-11 MED ORDER — CHLORHEXIDINE GLUCONATE CLOTH 2 % EX PADS: 6.0000 | MEDICATED_PAD | Freq: Once | CUTANEOUS | Status: DC

## 2020-08-11 MED ORDER — GABAPENTIN 300 MG PO CAPS
300.0000 mg | ORAL_CAPSULE | ORAL | Status: AC
  Administered 2020-08-11: 300 mg via ORAL

## 2020-08-11 MED ORDER — LIDOCAINE HCL (CARDIAC) PF 100 MG/5ML IV SOSY
PREFILLED_SYRINGE | INTRAVENOUS | Status: DC | PRN
  Administered 2020-08-11: 60 mg via INTRAVENOUS

## 2020-08-11 MED ORDER — PROPOFOL 500 MG/50ML IV EMUL
INTRAVENOUS | Status: DC | PRN
  Administered 2020-08-11: 25 ug/kg/min via INTRAVENOUS

## 2020-08-11 MED ORDER — PHENYLEPHRINE HCL (PRESSORS) 10 MG/ML IV SOLN
INTRAVENOUS | Status: DC | PRN
  Administered 2020-08-11 (×9): 80 ug via INTRAVENOUS

## 2020-08-11 MED ORDER — MIDAZOLAM HCL 2 MG/2ML IJ SOLN
INTRAMUSCULAR | Status: AC
  Filled 2020-08-11: qty 2

## 2020-08-11 MED ORDER — ACETAMINOPHEN 500 MG PO TABS
1000.0000 mg | ORAL_TABLET | ORAL | Status: AC
  Administered 2020-08-11: 1000 mg via ORAL

## 2020-08-11 MED ORDER — LACTATED RINGERS IV SOLN: INTRAVENOUS | Status: DC

## 2020-08-11 MED ORDER — 0.9 % SODIUM CHLORIDE (POUR BTL) OPTIME
TOPICAL | Status: DC | PRN
  Administered 2020-08-11: 500 mL

## 2020-08-11 MED ORDER — DEXAMETHASONE SODIUM PHOSPHATE 4 MG/ML IJ SOLN
INTRAMUSCULAR | Status: DC | PRN
  Administered 2020-08-11: 5 mg via INTRAVENOUS

## 2020-08-11 MED ORDER — POVIDONE-IODINE 10 % EX SOLN
CUTANEOUS | Status: DC | PRN
  Administered 2020-08-11: 1 via TOPICAL

## 2020-08-11 SURGICAL SUPPLY — 71 items
ADH SKN CLS APL DERMABOND .7 (GAUZE/BANDAGES/DRESSINGS) ×6
APL PRP STRL LF DISP 70% ISPRP (MISCELLANEOUS) ×6
BAG DECANTER FOR FLEXI CONT (MISCELLANEOUS) ×4 IMPLANT
BINDER BREAST LRG (GAUZE/BANDAGES/DRESSINGS) ×2 IMPLANT
BINDER BREAST MEDIUM (GAUZE/BANDAGES/DRESSINGS) IMPLANT
BLADE SURG 10 STRL SS (BLADE) ×4 IMPLANT
BLADE SURG 11 STRL SS (BLADE) ×2 IMPLANT
BNDG GAUZE ELAST 4 BULKY (GAUZE/BANDAGES/DRESSINGS) ×8 IMPLANT
CANISTER SUCT 1200ML W/VALVE (MISCELLANEOUS) ×4 IMPLANT
CHLORAPREP W/TINT 26 (MISCELLANEOUS) ×6 IMPLANT
COVER BACK TABLE 60X90IN (DRAPES) ×4 IMPLANT
COVER MAYO STAND STRL (DRAPES) ×6 IMPLANT
COVER SURGICAL LIGHT HANDLE (MISCELLANEOUS) ×2 IMPLANT
COVER WAND RF STERILE (DRAPES) IMPLANT
DECANTER SPIKE VIAL GLASS SM (MISCELLANEOUS) IMPLANT
DERMABOND ADVANCED (GAUZE/BANDAGES/DRESSINGS) ×2
DERMABOND ADVANCED .7 DNX12 (GAUZE/BANDAGES/DRESSINGS) ×6 IMPLANT
DRAIN CHANNEL 15F RND FF W/TCR (WOUND CARE) ×2 IMPLANT
DRAIN CHANNEL 19F RND (DRAIN) IMPLANT
DRAPE TOP ARMCOVERS (MISCELLANEOUS) ×4 IMPLANT
DRAPE U-SHAPE 76X120 STRL (DRAPES) ×4 IMPLANT
DRAPE UTILITY XL STRL (DRAPES) ×6 IMPLANT
DRSG PAD ABDOMINAL 8X10 ST (GAUZE/BANDAGES/DRESSINGS) ×8 IMPLANT
ELECT BLADE 4.0 EZ CLEAN MEGAD (MISCELLANEOUS)
ELECT COATED BLADE 2.86 ST (ELECTRODE) ×4 IMPLANT
ELECT REM PT RETURN 9FT ADLT (ELECTROSURGICAL) ×4
ELECTRODE BLDE 4.0 EZ CLN MEGD (MISCELLANEOUS) ×2 IMPLANT
ELECTRODE REM PT RTRN 9FT ADLT (ELECTROSURGICAL) ×3 IMPLANT
EVACUATOR SILICONE 100CC (DRAIN) ×2 IMPLANT
GLOVE SURG HYDRASOFT LTX SZ5.5 (GLOVE) ×8 IMPLANT
GOWN STRL REUS W/ TWL LRG LVL3 (GOWN DISPOSABLE) ×6 IMPLANT
GOWN STRL REUS W/TWL LRG LVL3 (GOWN DISPOSABLE) ×8
IMPL BREAST P6.1XRND LO 495 (Breast) ×1 IMPLANT
IMPL BRST P6.1XRND LO 495CC (Breast) ×3 IMPLANT
IMPLANT BREAST GEL 495CC (Breast) ×4 IMPLANT
NDL FILTER BLUNT 18X1 1/2 (NEEDLE) IMPLANT
NDL HYPO 25X1 1.5 SAFETY (NEEDLE) IMPLANT
NDL SAFETY ECLIPSE 18X1.5 (NEEDLE) ×3 IMPLANT
NEEDLE FILTER BLUNT 18X 1/2SAF (NEEDLE) ×1
NEEDLE FILTER BLUNT 18X1 1/2 (NEEDLE) ×3 IMPLANT
NEEDLE HYPO 18GX1.5 SHARP (NEEDLE) ×4
NEEDLE HYPO 25X1 1.5 SAFETY (NEEDLE) ×4 IMPLANT
NS IRRIG 1000ML POUR BTL (IV SOLUTION) ×4 IMPLANT
PACK BASIN DAY SURGERY FS (CUSTOM PROCEDURE TRAY) ×4 IMPLANT
PENCIL SMOKE EVACUATOR (MISCELLANEOUS) ×4 IMPLANT
PIN SAFETY STERILE (MISCELLANEOUS) ×2 IMPLANT
SHEET MEDIUM DRAPE 40X70 STRL (DRAPES) ×8 IMPLANT
SIZER BREAST REUSE 470CC (SIZER) ×4
SIZER BREAST REUSE 495CC (SIZER) ×4
SIZER BRST REUSE 470CC (SIZER) ×1 IMPLANT
SIZER BRST REUSE 495CC (SIZER) ×1 IMPLANT
SLEEVE SCD COMPRESS KNEE MED (MISCELLANEOUS) ×4 IMPLANT
SPONGE LAP 18X18 RF (DISPOSABLE) ×10 IMPLANT
STAPLER VISISTAT 35W (STAPLE) ×4 IMPLANT
SUT ETHILON 2 0 FS 18 (SUTURE) ×2 IMPLANT
SUT MNCRL AB 4-0 PS2 18 (SUTURE) ×6 IMPLANT
SUT PDS AB 2-0 CT2 27 (SUTURE) IMPLANT
SUT SILK 2 0 SH (SUTURE) IMPLANT
SUT VIC AB 3-0 PS1 18 (SUTURE) ×12
SUT VIC AB 3-0 PS1 18XBRD (SUTURE) ×5 IMPLANT
SUT VIC AB 3-0 SH 27 (SUTURE) ×4
SUT VIC AB 3-0 SH 27X BRD (SUTURE) ×3 IMPLANT
SUT VICRYL 4-0 PS2 18IN ABS (SUTURE) ×6 IMPLANT
SYR 20ML LL LF (SYRINGE) ×2 IMPLANT
SYR BULB IRRIG 60ML STRL (SYRINGE) ×6 IMPLANT
TOWEL GREEN STERILE FF (TOWEL DISPOSABLE) ×6 IMPLANT
TUBE CONNECTING 20X1/4 (TUBING) ×6 IMPLANT
TUBING INFILTRATION IT-10001 (TUBING) ×4 IMPLANT
TUBING SET GRADUATE ASPIR 12FT (MISCELLANEOUS) ×4 IMPLANT
UNDERPAD 30X36 HEAVY ABSORB (UNDERPADS AND DIAPERS) ×8 IMPLANT
YANKAUER SUCT BULB TIP NO VENT (SUCTIONS) ×4 IMPLANT

## 2020-08-11 NOTE — Transfer of Care (Signed)
Immediate Anesthesia Transfer of Care Note  Patient: Meredith [name]  Procedure(s) Performed: REMOVAL OF RIGHT TISSUE EXPANDER AND PLACEMENT OF IMPLANT (Right Breast) LEFT BREAST MASTOPEXY (Left Breast)  Patient Location: PACU  Anesthesia Type:General  Level of Consciousness: drowsy, patient cooperative and responds to stimulation  Airway & Oxygen Therapy: Patient Spontanous Breathing and Patient connected to face mask oxygen  Post-op Assessment: Report given to RN and Post -op Vital signs reviewed and stable  Post vital signs: Reviewed and stable  Last Vitals:  Vitals Value Taken Time  BP    Temp    Pulse 105 08/11/20 1026  Resp 18 08/11/20 1026  SpO2 100 % 08/11/20 1026  Vitals shown include unvalidated device data.  Last Pain:  Vitals:   08/11/20 0658  TempSrc: Oral  PainSc: 0-No pain         Complications: No complications documented.

## 2020-08-11 NOTE — Discharge Instructions (Signed)
  Post Anesthesia Home Care Instructions  Activity: Get plenty of rest for the remainder of the day. A responsible individual must stay with you for 24 hours following the procedure.  For the next 24 hours, DO NOT: -Drive a car -Paediatric nurse -Drink alcoholic beverages -Take any medication unless instructed by your physician -Make any legal decisions or sign important papers.  Meals: Start with liquid foods such as gelatin or soup. Progress to regular foods as tolerated. Avoid greasy, spicy, heavy foods. If nausea and/or vomiting occur, drink only clear liquids until the nausea and/or vomiting subsides. Call your physician if vomiting continues.  Special Instructions/Symptoms: Your throat may feel dry or sore from the anesthesia or the breathing tube placed in your throat during surgery. If this causes discomfort, gargle with warm salt water. The discomfort should disappear within 24 hours.  If you had a scopolamine patch placed behind your ear for the management of post- operative nausea and/or vomiting:  1. The medication in the patch is effective for 72 hours, after which it should be removed.  Wrap patch in a tissue and discard in the trash. Wash hands thoroughly with soap and water. 2. You may remove the patch earlier than 72 hours if you experience unpleasant side effects which may include dry mouth, dizziness or visual disturbances. 3. Avoid touching the patch. Wash your hands with soap and water after contact with the patch.    You may have Tylenol after 1pm today, if needed.  You also had Oxycodone at 1130am today.  If you take that again, it should be after 3:30pm.       JP Drain Totals  Bring this sheet to all of your post-operative appointments while you have your drains.  Please measure your drains by CC's or ML's.  Make sure you drain and measure your JP Drains 2 or 3 times per day.  At the end of each day, add up totals for the left side and add up totals for  the right side.    ( 9 am )     ( 3 pm )        ( 9 pm )                Date L  R  L  R  L  R  Total L/R

## 2020-08-11 NOTE — Op Note (Signed)
Operative Note   DATE OF OPERATION: 2.22.22  LOCATION: [facility]-outpatient  SURGICAL DIVISION: Plastic Surgery  PREOPERATIVE DIAGNOSES:  1. 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 Removal right chest tissue expander and placement silicone implant 2. Left breast mastopexy  SURGEON: Irene Limbo MD MBA  ASSISTANT: none  ANESTHESIA:  General.   EBL: 30 ml  COMPLICATIONS: None immediate.   INDICATIONS FOR PROCEDURE:  The patient, Meredith [name], is a 50 y.o. female born on 03-Dec-1970, is here for staged breast reconstruction following right skin reduction mastectomy and immediate prepectoral expander acellular dermis reconstruction.   FINDINGS: Natrelle Soft Touch Smooth Round Extra Projection 495 ml implant placed REF [id number]. Left mastopexy 72 g  DESCRIPTION OF PROCEDURE:  The patient's operative site was marked with the patient in the preoperative area including chest midline anterior axillary lines breast meridian. Over left breast location for placement nipple areola complex marked by palpation over anterior surface breast. With aide Wise pattern marker, medial and lateral limbs (6 cm) for resection marked by displacement breast against meridian.  The patient was taken to the operating room. SCDs were placed and IV antibiotics were given. The patient's operative site was prepped and draped in a sterile fashion. A time out was performed and all information was confirmed to be correct.   Incision made in right inframammary fold scar and carried through superficial fascia and acellular dermis. Expander removed. Full incorporation ADM noted. Capsulotomies performed superiorly. Sizer placed.   Over left chest, lateral limbs for resection marked in supine position. Superior pedicle designed and nipple areolar complexincisedwith 60mm diameter marker on stretch. Pedicle deepithlialized and developedto  chest wall. Medial and lateral pillars developed. Over lower pole, inferiorly based dermoglandular pedicle maintained as auto augmentation flap and deepithelialized. Lower ole tissue excised over lower outer breast. Patient tailor tacked closed and brought to upright sitting position. Extra Projection 495 ml implant selected over right chest. Patient returned to supine position. Over left breast cavity irrigated and hemostasis obtained. Local anesthetic infiltrated. 15 Fr JP placed and secured with 2-0 nylon. Inferiorly based dermoglandular pedicle advanced superiorly beneath pedicle and secured to chest wall with 2-0 PDS suture. Closure completed bilateralwith 3-0 vicryl to approximate dermis along inframammary fold and vertical limb. NAC inset with 4-0 vicryl in dermis. Skin closure completed with 4-0 monocryl subcuticular throughout.   Over right chest, cavity irrigated with saline solution containing Ancef, gentamicin, and Betadine. Hemostasis ensured. Implant placed in cavity. Orientation implant ensured. Closure completed with 3-0 vicryl in superficial fascia and ADM, 4-0 vicryl in dermis, 4-0 monocryl subcuticular skin closure.Tissue adhesive applied to all incisions. Dry dressing and breast binder applied. The patient was allowed to wake from anesthesia, extubated and taken to the recovery room in satisfactory condition.   SPECIMENS: left breast mastopexy  DRAINS: 15 Fr JP in left breast

## 2020-08-11 NOTE — Anesthesia Preprocedure Evaluation (Addendum)
Anesthesia Evaluation  Patient identified by MRN, date of birth, ID band Patient awake    Reviewed: Allergy & Precautions, NPO status , Patient's Chart, lab work & pertinent test results  History of Anesthesia Complications Negative for: history of anesthetic complications  Airway Mallampati: II  TM Distance: >3 FB Neck ROM: Full    Dental  (+) Teeth Intact   Pulmonary neg pulmonary ROS,    Pulmonary exam normal        Cardiovascular negative cardio ROS Normal cardiovascular exam     Neuro/Psych negative neurological ROS  negative psych ROS   GI/Hepatic negative GI ROS, Neg liver ROS,   Endo/Other  Hypothyroidism   Renal/GU negative Renal ROS  negative genitourinary   Musculoskeletal negative musculoskeletal ROS (+)   Abdominal   Peds  Hematology negative hematology ROS (+)   Anesthesia Other Findings  Breast cancer  Reproductive/Obstetrics                           Anesthesia Physical Anesthesia Plan  ASA: II  Anesthesia Plan: General   Post-op Pain Management:    Induction: Intravenous  PONV Risk Score and Plan: 3 and Ondansetron, Dexamethasone, Midazolam and Treatment may vary due to age or medical condition  Airway Management Planned: Oral ETT  Additional Equipment: None  Intra-op Plan:   Post-operative Plan: Extubation in OR  Informed Consent: I have reviewed the patients History and Physical, chart, labs and discussed the procedure including the risks, benefits and alternatives for the proposed anesthesia with the patient or authorized representative who has indicated his/her understanding and acceptance.     Dental advisory given  Plan Discussed with:   Anesthesia Plan Comments:        Anesthesia Quick Evaluation

## 2020-08-11 NOTE — Anesthesia Procedure Notes (Signed)
Procedure Name: Intubation Date/Time: 08/11/2020 7:39 AM Performed by: Glory Buff, CRNA Pre-anesthesia Checklist: Patient identified, Emergency Drugs available, Suction available and Patient being monitored Patient Re-evaluated:Patient Re-evaluated prior to induction Oxygen Delivery Method: Circle system utilized Preoxygenation: Pre-oxygenation with 100% oxygen Induction Type: IV induction Ventilation: Mask ventilation without difficulty Laryngoscope Size: Miller and 3 Grade View: Grade I Tube type: Oral Tube size: 7.0 mm Number of attempts: 1 Airway Equipment and Method: Stylet and Oral airway Placement Confirmation: ETT inserted through vocal cords under direct vision,  positive ETCO2 and breath sounds checked- equal and bilateral Secured at: 21 cm Tube secured with: Tape Dental Injury: Teeth and Oropharynx as per pre-operative assessment

## 2020-08-11 NOTE — Anesthesia Postprocedure Evaluation (Signed)
Anesthesia Post Note  Patient: Meredith [name]  Procedure(s) Performed: REMOVAL OF RIGHT TISSUE EXPANDER AND PLACEMENT OF IMPLANT (Right Breast) LEFT BREAST MASTOPEXY (Left Breast)     Patient location during evaluation: PACU Anesthesia Type: General Level of consciousness: awake and alert Pain management: pain level controlled Vital Signs Assessment: post-procedure vital signs reviewed and stable Respiratory status: spontaneous breathing, nonlabored ventilation and respiratory function stable Cardiovascular status: blood pressure returned to baseline and stable Postop Assessment: no apparent nausea or vomiting Anesthetic complications: no   No complications documented.  Last Vitals:  Vitals:   08/11/20 1100 08/11/20 1145  BP: 110/83 116/80  Pulse: 98 94  Resp: 12 14  Temp:  36.9 C  SpO2: 98% 97%    Last Pain:  Vitals:   08/11/20 1145  TempSrc:   PainSc: 2                  Lidia Collum

## 2020-08-11 NOTE — Interval H&P Note (Signed)
History and Physical Interval Note:  08/11/2020 6:50 AM  Meredith [name]  has presented today for surgery, with the diagnosis of history right breast ca, acquired absence breast.  The various methods of treatment have been discussed with the patient and family. After consideration of risks, benefits and other options for treatment, the patient has consented to  Procedure(s): REMOVAL OF RIGHT TISSUE EXPANDER AND PLACEMENT OF IMPLANT (Right) POSSIBLE LIPOFILLING TO RIGHT CHEST (Right) LEFT BREAST MASTOPEXY (Left) as a surgical intervention.  The patient's history has been reviewed, patient examined, no change in status, stable for surgery.  I have reviewed the patient's chart and labs.  Questions were answered to the patient's satisfaction.     Arnoldo Hooker Meredith [name]

## 2020-08-12 ENCOUNTER — Encounter (HOSPITAL_BASED_OUTPATIENT_CLINIC_OR_DEPARTMENT_OTHER): Payer: Self-pay | Admitting: Plastic Surgery

## 2020-08-12 LAB — SURGICAL PATHOLOGY

## 2020-08-17 ENCOUNTER — Encounter: Payer: BC Managed Care – PPO | Admitting: Adult Health

## 2020-09-10 ENCOUNTER — Inpatient Hospital Stay: Payer: BC Managed Care – PPO | Attending: Adult Health | Admitting: Adult Health

## 2020-09-10 ENCOUNTER — Encounter: Payer: Self-pay | Admitting: Adult Health

## 2020-09-10 ENCOUNTER — Other Ambulatory Visit: Payer: Self-pay

## 2020-09-10 VITALS — BP 99/81 | HR 95 | Temp 97.5°F | Resp 18 | Ht 63.0 in | Wt 155.8 lb

## 2020-09-10 DIAGNOSIS — Z79810 Long term (current) use of selective estrogen receptor modulators (SERMs): Secondary | ICD-10-CM | POA: Insufficient documentation

## 2020-09-10 DIAGNOSIS — E039 Hypothyroidism, unspecified: Secondary | ICD-10-CM | POA: Diagnosis not present

## 2020-09-10 DIAGNOSIS — Z9011 Acquired absence of right breast and nipple: Secondary | ICD-10-CM | POA: Insufficient documentation

## 2020-09-10 DIAGNOSIS — Z170 Estrogen receptor positive status [ER+]: Secondary | ICD-10-CM | POA: Insufficient documentation

## 2020-09-10 DIAGNOSIS — Z79899 Other long term (current) drug therapy: Secondary | ICD-10-CM | POA: Insufficient documentation

## 2020-09-10 DIAGNOSIS — D0511 Intraductal carcinoma in situ of right breast: Secondary | ICD-10-CM | POA: Diagnosis present

## 2020-09-10 DIAGNOSIS — R911 Solitary pulmonary nodule: Secondary | ICD-10-CM | POA: Diagnosis not present

## 2020-09-10 NOTE — Progress Notes (Signed)
SURVIVORSHIP VISIT:    BRIEF ONCOLOGIC HISTORY:  Oncology History  Ductal carcinoma in situ (DCIS) of right breast  02/14/2020 Initial Diagnosis   Screening mammogram detected right breast calcifications spanning 1.4cm. Biopsy showed DCIS, high grade, ER+ 95%, PR+ 30%.   02/19/2020 Cancer Staging   Staging form: Breast, AJCC 8th Edition - Clinical stage from 02/19/2020: Stage 0 (cTis (DCIS), cN0, cM0, ER+, PR+)   02/25/2020 Genetic Testing   No pathogenic variants detected in [facility] gene panel.  The Breast/GYN Cancer Panel offered by [facility] includes sequencing and rearrangement analysis for the following 20 genes:  ATM, BARD1, BRCA1, BRCA2, BRIP1, CDH1, CHEK2, EPCAM (deletion/duplication analysis only), MLH1, MSH2, MSH6, NBN, NF1, PALB2, PMS2, PTEN, RAD51C, RAD51D, STK11 and TP53.  The report date is February 25, 2020.    05/11/2020 Surgery   Right mastectomy with reconstruction Lucia Gaskins & Thimmappa) [phone]): DCIS, high grade, involving the posterior margin, 1 right axillary lymph node negative for carcinoma.    05/2020 -  Anti-estrogen oral therapy   Tamoxifen. 10 mg for 1 month then increase to 20 mg.     INTERVAL HISTORY:  Meredith [name] to review her survivorship care plan detailing her treatment course for breast cancer, as well as monitoring long-term side effects of that treatment, education regarding health maintenance, screening, and overall wellness and health promotion.     Overall, Meredith [name] reports feeling quite well.  She underwent breast reconstruction with right implant placement and left breast reduction and lift.  She notes that the glue on her left breast was scabbed over and Dr. Iran Planas removed that area yesterday.  There is mild erythema, however she has no swelling, warmth, drainage, fever or chills.    She would like to re-review her risk of recurrence with and without Tamoxifen again.  She also would like to talk about future imaging needs.  She notes that her  breast surgeon has changed from Dr. Lucia Gaskins who retired to Dr. Donne Hazel.  She sees him in June of this year.   REVIEW OF SYSTEMS:  Review of Systems  Constitutional: Negative for appetite change, chills, fatigue, fever and unexpected weight change.  HENT:   Negative for hearing loss, lump/mass and trouble swallowing.   Eyes: Negative for eye problems and icterus.  Respiratory: Negative for chest tightness, cough and shortness of breath.   Cardiovascular: Negative for chest pain, leg swelling and palpitations.  Gastrointestinal: Negative for abdominal distention, abdominal pain, constipation, diarrhea, nausea and vomiting.  Endocrine: Negative for hot flashes.  Genitourinary: Negative for difficulty urinating.   Musculoskeletal: Negative for arthralgias.  Skin: Negative for itching and rash.  Neurological: Negative for dizziness, extremity weakness, headaches and numbness.  Hematological: Negative for adenopathy. Does not bruise/bleed easily.  Psychiatric/Behavioral: Negative for depression. The patient is not nervous/anxious.    Breast: Denies any new nodularity, masses, tenderness, nipple changes, or nipple discharge.      ONCOLOGY TREATMENT TEAM:  1. Surgeon:  Dr. Lucia Gaskins at [facility] 2. Medical Oncologist: Dr. Lindi Adie      PAST MEDICAL/SURGICAL HISTORY:  Past Medical History:  Diagnosis Date  . Breast cancer ([place]) 2021   right breast DCIS-mastectomy  . Family history of prostate cancer 02/20/2020  . Hypothyroidism    Past Surgical History:  Procedure Laterality Date  . AXILLARY SENTINEL NODE BIOPSY Right 05/11/2020   Procedure: AXILLARY SENTINEL LYMPH NODE BIOPSY;  Surgeon: Alphonsa Overall, MD;  Location: [place];  Service: General;  Laterality: Right;  . BREAST RECONSTRUCTION WITH PLACEMENT  OF TISSUE EXPANDER AND ALLODERM Right 05/11/2020   Procedure: BREAST RECONSTRUCTION WITH PLACEMENT OF TISSUE EXPANDER AND ALLODERM;  Surgeon: Thimmappa, Brinda, MD;  Location:  [facility] OR;  Service: Plastics;  Laterality: Right;  . CESAREAN SECTION    . MASTECTOMY W/ SENTINEL NODE BIOPSY Right 05/11/2020   Procedure: RIGHT MASTECTOMY;  Surgeon: Newman, David, MD;  Location: [facility] OR;  Service: General;  Laterality: Right;  PEC BLOCK  . MASTOPEXY Left 08/11/2020   Procedure: LEFT BREAST MASTOPEXY;  Surgeon: Thimmappa, Brinda, MD;  Location: [facility];  Service: Plastics;  Laterality: Left;  . MOHS SURGERY     basal cell  . REMOVAL OF TISSUE EXPANDER AND PLACEMENT OF IMPLANT Right 08/11/2020   Procedure: REMOVAL OF RIGHT TISSUE EXPANDER AND PLACEMENT OF IMPLANT;  Surgeon: Thimmappa, Brinda, MD;  Location: [facility];  Service: Plastics;  Laterality: Right;     ALLERGIES:  Allergies  Allergen Reactions  . Demerol [Meperidine] Nausea And Vomiting  . Phenobarbital Other (See Comments)    As a baby, made her hyperactive     CURRENT MEDICATIONS:  Outpatient Encounter Medications as of 09/10/2020  Medication Sig Note  . cetirizine (ZYRTEC) 10 MG tablet Take 10 mg by mouth daily as needed for allergies.    . levonorgestrel (MIRENA, 52 MG,) 20 MCG/24HR IUD 1 each by Intrauterine route once.    . levothyroxine (SYNTHROID) 100 MCG tablet Take 100 mcg by mouth daily before breakfast.   . tamoxifen (NOLVADEX) 10 MG tablet Take 1 tablet (10 mg total) by mouth 2 (two) times daily. 09/10/2020: Currently taking 1 tab PO.   No facility-administered encounter medications on file as of 09/10/2020.     ONCOLOGIC FAMILY HISTORY:  Family History  Problem Relation Age of Onset  . Prostate cancer Maternal Grandfather   . Skin cancer Maternal Grandfather   . Cancer Maternal Aunt        dx mid 60s; GYN cancer     GENETIC COUNSELING/TESTING: See above  SOCIAL HISTORY:  Social History   Socioeconomic History  . Marital status: Married    Spouse name: Not on file  . Number of children: Not on file  . Years of education: Not on file  . Highest  education level: Not on file  Occupational History  . Not on file  Tobacco Use  . Smoking status: Never Smoker  . Smokeless tobacco: Never Used  Vaping Use  . Vaping Use: Never used  Substance and Sexual Activity  . Alcohol use: Yes    Comment: socially  . Drug use: Never  . Sexual activity: Yes    Birth control/protection: Post-menopausal, I.U.D.  Other Topics Concern  . Not on file  Social History Narrative  . Not on file   Social Determinants of Health   Financial Resource Strain: Low Risk   . Difficulty of Paying Living Expenses: Not hard at all  Food Insecurity: No Food Insecurity  . Worried About Running Out of Food in the Last Year: Never true  . Ran Out of Food in the Last Year: Never true  Transportation Needs: No Transportation Needs  . Lack of Transportation (Medical): No  . Lack of Transportation (Non-Medical): No  Physical Activity: Not on file  Stress: Not on file  Social Connections: Not on file  Intimate Partner Violence: Not on file     OBSERVATIONS/OBJECTIVE:  BP 99/81 (BP Location: Left Arm, Patient Position: Sitting)   Pulse 95   Temp (!) 97.5 F (  36.4 C) (Tympanic)   Resp 18   Ht 5' 3" (1.6 m)   Wt 155 lb 12.8 oz (70.7 kg)   LMP 05/18/2011 Comment: has IUD  SpO2 99%   BMI 27.60 kg/m  GENERAL: Patient is a well appearing female in no acute distress HEENT:  Sclerae anicteric.  Oropharynx clear and moist. No ulcerations or evidence of oropharyngeal candidiasis. Neck is supple.  NODES:  No cervical, supraclavicular, or axillary lymphadenopathy palpated.  BREAST EXAM:  Right breast s/p mastectomy and reconstruction, left breast s/p reduction and lift, mild erythema surrounding area where scab has been removed on lower scar.  The area is open and healing, no drainage, no sign of infection.  Her breast exam was visual inspection only. LUNGS:  Clear to auscultation bilaterally.  No wheezes or rhonchi. HEART:  Regular rate and rhythm. No murmur  appreciated. ABDOMEN:  Soft, nontender.  Positive, normoactive bowel sounds. No organomegaly palpated. MSK:  No focal spinal tenderness to palpation. Full range of motion bilaterally in the upper extremities. EXTREMITIES:  No peripheral edema.   SKIN:  Clear with no obvious rashes or skin changes. No nail dyscrasia. NEURO:  Nonfocal. Well oriented.  Appropriate affect.   LABORATORY DATA:  None for this visit.  DIAGNOSTIC IMAGING:  None for this visit.      ASSESSMENT AND PLAN:  Ms.. [name] is a pleasant 49 y.o. female with Stage 0 right breast DCIS, ER+/PR+, diagnosed in 01/2020, treated with mastectomy, and anti-estrogen therapy with Tamoxifen beginning in 05/2020.  She presents to the Survivorship Clinic for our initial meeting and routine follow-up post-completion of treatment for breast cancer.    1. Stage 0 right breast cancer:  Meredith [name] is continuing to recover from definitive treatment for breast cancer. She will follow-up with her medical oncologist, Dr. Gudena in 6 months with history and physical exam per surveillance protocol.  She will continue her anti-estrogen therapy with Tamoxifen. Thus far, she is tolerating the Tamoxifen well, with minimal side effects.  Since she is concerned about side effects, I recommended that she stay at the 10mg dose for a couple of months and then if she feels comfortable go ahead and increase to the 20mg.  She was instructed to make Dr. Gudena or myself aware if she begins to experience any worsening side effects of the medication and I could see her back in clinic to help manage those side effects, as needed.   She has a few imaging needs.  Considering the extent of her DCIS not picked up completely by mammogram is due in 01/2021, she was recommended to undergo annual breast MRI beginning in February, 2023.  She also had a CT chest in 02/2020 that showed some small indeterminate lung nodules.  I have ordered repeat CT chest in 02/2021.  Considering her  breast density and recent surgery, I went ahead and ordered her left breast mammogram as diagnostic that way she can get the results day of, and they can do ultrasound if needed due to her surgery and healing.    I reviewed the [facility] DCIS nomogram with her.  Her risk of recurrence without Tamoxifen in 5 years is 10%, and with Tamoxifen is 5%.  She understands this.    Today, a comprehensive survivorship care plan and treatment summary was reviewed with the patient today detailing her breast cancer diagnosis, treatment course, potential late/long-term effects of treatment, appropriate follow-up care with recommendations for the future, and patient education resources.  A copy of   this summary, along with a letter will be sent to the patient's primary care provider via mail/fax/In Basket message after today's visit.     2. Bone health:    She was given education on specific activities to promote bone health.  3. Cancer screening:  Due to Meredith [name]'s history and her age, she should receive screening for skin cancers, colon cancer, and gynecologic cancers.  The information and recommendations are listed on the patient's comprehensive care plan/treatment summary and were reviewed in detail with the patient.    4. Health maintenance and wellness promotion: Meredith [name] was encouraged to consume 5-7 servings of fruits and vegetables per day. We reviewed the "Nutrition Rainbow" handout, as well as the handout "Take Control of Your Health and Reduce Your Cancer Risk" from the [facility].  She was also encouraged to engage in moderate to vigorous exercise for 30 minutes per day most days of the week. We discussed the [employer] fitness program, which is designed for cancer survivors to help them become more physically fit after cancer treatments.  She was instructed to limit her alcohol consumption and continue to abstain from tobacco use.     5. Support services/counseling: It is not uncommon for  this period of the patient's cancer care trajectory to be one of many emotions and stressors.  We discussed how this can be increasingly difficult during the times of quarantine and social distancing due to the COVID-19 pandemic.   She was given information regarding our available services and encouraged to contact me with any questions or for help enrolling in any of our support group/programs.    Follow up instructions:    -Return to cancer center in 6 months for f/u with Dr. Lindi Adie  -Mammogram due in 01/2021 -Breast MRI in 07/2021 -CT chest in 02/2021 -Follow up with Dr. Donne Hazel in 11/2020, then at least every March annually.   -She is welcome to return back to the Survivorship Clinic at any time; no additional follow-up needed at this time.  -Consider referral back to survivorship as a long-term survivor for continued surveillance  The patient was provided an opportunity to ask questions and all were answered. The patient agreed with the plan and demonstrated an understanding of the instructions.   Total encounter time: 50 minutes*  Wilber Bihari, NP 09/10/20 9:43 AM Medical Oncology and Hematology [facility] [place], [place] [address] Tel. [phone]    Fax. [phone]  *Total Encounter Time as defined by the Centers for Medicare and Medicaid Services includes, in addition to the face-to-face time of a patient visit (documented in the note above) non-face-to-face time: obtaining and reviewing outside history, ordering and reviewing medications, tests or procedures, care coordination (communications with other health care professionals or caregivers) and documentation in the medical record.

## 2020-09-11 ENCOUNTER — Telehealth: Payer: Self-pay | Admitting: Adult Health

## 2020-09-11 NOTE — Telephone Encounter (Signed)
Scheduled appt per 3/24 los. Called pt, no answer. Left msg with appt date and time.

## 2020-11-25 ENCOUNTER — Other Ambulatory Visit: Payer: Self-pay | Admitting: Plastic Surgery

## 2020-11-25 DIAGNOSIS — N6310 Unspecified lump in the right breast, unspecified quadrant: Secondary | ICD-10-CM

## 2020-12-11 ENCOUNTER — Other Ambulatory Visit: Payer: Self-pay

## 2020-12-11 ENCOUNTER — Ambulatory Visit
Admission: RE | Admit: 2020-12-11 | Discharge: 2020-12-11 | Disposition: A | Discharge status: Home / Self Care | Payer: BC Managed Care – PPO | Source: Ambulatory Visit | Attending: Plastic Surgery | Admitting: Plastic Surgery

## 2020-12-11 ENCOUNTER — Other Ambulatory Visit: Payer: Self-pay | Admitting: Plastic Surgery

## 2020-12-11 DIAGNOSIS — N6310 Unspecified lump in the right breast, unspecified quadrant: Secondary | ICD-10-CM

## 2020-12-30 ENCOUNTER — Other Ambulatory Visit: Payer: Self-pay | Admitting: Plastic Surgery

## 2020-12-30 DIAGNOSIS — Z1231 Encounter for screening mammogram for malignant neoplasm of breast: Secondary | ICD-10-CM

## 2021-02-02 ENCOUNTER — Ambulatory Visit
Admission: RE | Admit: 2021-02-02 | Discharge: 2021-02-02 | Disposition: A | Discharge status: Home / Self Care | Payer: BC Managed Care – PPO | Source: Ambulatory Visit | Attending: Plastic Surgery | Admitting: Plastic Surgery

## 2021-02-02 ENCOUNTER — Other Ambulatory Visit: Payer: Self-pay

## 2021-02-02 DIAGNOSIS — Z1231 Encounter for screening mammogram for malignant neoplasm of breast: Secondary | ICD-10-CM

## 2021-02-20 ENCOUNTER — Other Ambulatory Visit: Payer: Self-pay | Admitting: Hematology and Oncology

## 2021-03-15 ENCOUNTER — Inpatient Hospital Stay: Payer: BC Managed Care – PPO | Attending: Hematology and Oncology | Admitting: Hematology and Oncology

## 2021-03-15 NOTE — Assessment & Plan Note (Deleted)
02/14/2020:Screening mammogram detected right breast calcifications spanning 1.4cm. Biopsy showed DCIS, high grade, ER+ 95%, PR+ 30%.Tis NX stage 0  05/11/2020: Rt Mastectomy: HG DCIS spans 7 cm. Focal Pos Posterior margin. ER/ PR Pos Treatment Plan: Adjuvant Tamoxifen (we will start her at 10 mg daily for 1 month starting 06/03/2020.  If she tolerates it well then we will increase it to 20 mg daily)  Tamoxifen toxicities:  Breast cancer surveillance:  She will need a breast MRI annually in February.   Right breast mammogram and ultrasound: 12/11/2020: Benign fat necrosis Left breast mammogram: 02/03/2021: Benign  Return to clinic in 1 year for follow-up

## 2021-03-22 ENCOUNTER — Other Ambulatory Visit: Payer: Self-pay

## 2021-03-22 ENCOUNTER — Ambulatory Visit (HOSPITAL_COMMUNITY)
Admission: RE | Admit: 2021-03-22 | Discharge: 2021-03-22 | Disposition: A | Discharge status: Home / Self Care | Payer: BC Managed Care – PPO | Source: Ambulatory Visit | Attending: Adult Health | Admitting: Adult Health

## 2021-03-22 DIAGNOSIS — R911 Solitary pulmonary nodule: Secondary | ICD-10-CM | POA: Insufficient documentation

## 2021-03-22 DIAGNOSIS — D0511 Intraductal carcinoma in situ of right breast: Secondary | ICD-10-CM | POA: Insufficient documentation

## 2021-03-22 MED ORDER — IOHEXOL 350 MG/ML SOLN
60.0000 mL | Freq: Once | INTRAVENOUS | Status: AC | PRN
  Administered 2021-03-22: 60 mL via INTRAVENOUS

## 2021-03-23 NOTE — Progress Notes (Signed)
 Patient Care Team: [name], Sherry M, MD as PCP - General (Family Medicine) Gudena, Vinay, MD as Consulting Physician (Hematology and Oncology) Thimmappa, Brinda, MD as Consulting Physician (Plastic Surgery) Wakefield, Matthew, MD as Consulting Physician (General Surgery)  DIAGNOSIS:    ICD-10-CM   1. Ductal carcinoma in situ (DCIS) of right breast  D05.11       SUMMARY OF ONCOLOGIC HISTORY: Oncology History  Ductal carcinoma in situ (DCIS) of right breast  02/14/2020 Initial Diagnosis   Screening mammogram detected right breast calcifications spanning 1.4cm. Biopsy showed DCIS, high grade, ER+ 95%, PR+ 30%.   02/19/2020 Cancer Staging   Staging form: Breast, AJCC 8th Edition - Clinical stage from 02/19/2020: Stage 0 (cTis (DCIS), cN0, cM0, ER+, PR+)   02/25/2020 Genetic Testing   No pathogenic variants detected in [facility] gene panel.  The Breast/GYN Cancer Panel offered by [facility] includes sequencing and rearrangement analysis for the following 20 genes:  ATM, BARD1, BRCA1, BRCA2, BRIP1, CDH1, CHEK2, EPCAM (deletion/duplication analysis only), MLH1, MSH2, MSH6, NBN, NF1, PALB2, PMS2, PTEN, RAD51C, RAD51D, STK11 and TP53.  The report date is February 25, 2020.    05/11/2020 Surgery   Right mastectomy with reconstruction ([facility]) ([id number]): DCIS, high grade, involving the posterior margin, 1 right axillary lymph node negative for carcinoma.    05/2020 -  Anti-estrogen oral therapy   Tamoxifen. 10 mg for 1 month then increase to 20 mg.     CHIEF COMPLIANT: Follow-up of right breast cancer  INTERVAL HISTORY: Meredith [name] is a 50 y.o. with above-mentioned history of right breast cancer having undergone right mastectomy with reconstruction. She presents to the clinic today for follow-up.   ALLERGIES:  is allergic to demerol [meperidine] and phenobarbital.  MEDICATIONS:  Current Outpatient Medications  Medication Sig Dispense Refill   cetirizine (ZYRTEC) 10 MG  tablet Take 10 mg by mouth daily as needed for allergies.      levonorgestrel (MIRENA, 52 MG,) 20 MCG/24HR IUD 1 each by Intrauterine route once.      levothyroxine (SYNTHROID) 100 MCG tablet Take 100 mcg by mouth daily before breakfast.     tamoxifen (NOLVADEX) 10 MG tablet TAKE 1 TABLET BY MOUTH TWICE A DAY 180 tablet 1   No current facility-administered medications for this visit.    PHYSICAL EXAMINATION: ECOG PERFORMANCE STATUS: 1 - Symptomatic but completely ambulatory  Vitals:   03/24/21 1507  BP: (!) 129/92  Pulse: 78  Resp: 18  Temp: (!) 97.5 F (36.4 C)  SpO2: 100%   Filed Weights   03/24/21 1507  Weight: 157 lb 3.2 oz (71.3 kg)    BREAST: No palpable masses or nodules in either right or left breasts. No palpable axillary supraclavicular or infraclavicular adenopathy no breast tenderness or nipple discharge. (exam performed in the presence of a chaperone)  LABORATORY DATA:  I have reviewed the data as listed CMP Latest Ref Rng & Units 02/19/2020  Glucose 70 - 99 mg/dL 87  BUN 6 - 20 mg/dL 14  Creatinine 0.44 - 1.00 mg/dL 0.83  Sodium 135 - 145 mmol/L 140  Potassium 3.5 - 5.1 mmol/L 4.6  Chloride 98 - 111 mmol/L 105  CO2 22 - 32 mmol/L 28  Calcium 8.9 - 10.3 mg/dL 10.5(H)  Total Protein 6.5 - 8.1 g/dL 7.5  Total Bilirubin 0.3 - 1.2 mg/dL 0.7  Alkaline Phos 38 - 126 U/L 74  AST 15 - 41 U/L 22  ALT 0 - 44 U/L 16      Lab Results  Component Value Date   WBC 4.5 05/07/2020   HGB 13.2 05/07/2020   HCT 40.9 05/07/2020   MCV 92.3 05/07/2020   PLT 250 05/07/2020   NEUTROABS 2.2 02/19/2020    ASSESSMENT & PLAN:  Ductal carcinoma in situ (DCIS) of right breast 02/14/2020:Screening mammogram detected right breast calcifications spanning 1.4cm. Biopsy showed DCIS, high grade, ER+ 95%, PR+ 30%.  Tis NX stage 0   05/11/2020: Rt Mastectomy: HG DCIS spans 7 cm. Focal Pos Posterior margin. ER/ PR Pos Treatment Plan: Adjuvant Tamoxifen   Patient is experiencing severe  hot flashes.  Therefore I recommended that she cut down the dosage of tamoxifen to 10 mg a day.   Breast cancer surveillance:  She will need a breast MRI annually in February.   mammogram of the left breast in August 2022: Benign breast density category C Mammogram of the right breast 12/11/2020: Benign fat necrosis reconstructed right breast  RTC in 1 year for follow-up    No orders of the defined types were placed in this encounter.  The patient has a good understanding of the overall plan. she agrees with it. she will call with any problems that may develop before the next visit here.  Total time spent: 20 mins including face to face time and time spent for planning, charting and coordination of care  Rulon Eisenmenger, MD, MPH 03/24/2021  I, Thana Ates, am acting as scribe for Dr. Nicholas Lose.  I have reviewed the above documentation for accuracy and completeness, and I agree with the above.

## 2021-03-24 ENCOUNTER — Inpatient Hospital Stay: Payer: BC Managed Care – PPO | Attending: Hematology and Oncology | Admitting: Hematology and Oncology

## 2021-03-24 ENCOUNTER — Other Ambulatory Visit: Payer: Self-pay

## 2021-03-24 DIAGNOSIS — Z79810 Long term (current) use of selective estrogen receptor modulators (SERMs): Secondary | ICD-10-CM | POA: Insufficient documentation

## 2021-03-24 DIAGNOSIS — D0511 Intraductal carcinoma in situ of right breast: Secondary | ICD-10-CM | POA: Insufficient documentation

## 2021-03-24 DIAGNOSIS — Z170 Estrogen receptor positive status [ER+]: Secondary | ICD-10-CM | POA: Insufficient documentation

## 2021-03-24 MED ORDER — TAMOXIFEN CITRATE 10 MG PO TABS: 10.0000 mg | ORAL_TABLET | Freq: Every day | ORAL | 1 refills | Status: DC

## 2021-03-24 NOTE — Assessment & Plan Note (Signed)
02/14/2020:Screening mammogram detected right breast calcifications spanning 1.4cm. Biopsy showed DCIS, high grade, ER+ 95%, PR+ 30%.Tis NX stage 0  05/11/2020: Rt Mastectomy: HG DCIS spans 7 cm. Focal Pos Posterior margin. ER/ PR Pos Treatment Plan: Adjuvant Tamoxifen (we will start her at 10 mg daily for 1 month starting 06/03/2020.  If she tolerates it well then we will increase it to 20 mg daily)  Breast cancer surveillance:  She will need a breast MRI annually in February.   mammogram of the left breast in August 2022: Benign breast density category C Mammogram of the right breast 12/11/2020: Benign fat necrosis reconstructed right breast  RTC in 1 year for follow-up

## 2021-03-26 ENCOUNTER — Encounter: Payer: Self-pay | Admitting: *Deleted

## 2021-03-26 NOTE — Progress Notes (Signed)
Per Wilber Bihari, NP pt recent chest CT negative for metastatic disease.  CT does show fatty liver and pt needing to f/u with PCP.  RN alerted pt of results/recommendations and verbalized understanding.

## 2021-05-07 LAB — COLOGUARD: COLOGUARD: NEGATIVE

## 2021-05-27 ENCOUNTER — Encounter: Payer: Self-pay | Admitting: Plastic Surgery

## 2021-07-20 ENCOUNTER — Encounter: Payer: Self-pay | Admitting: Adult Health

## 2021-07-21 ENCOUNTER — Ambulatory Visit (HOSPITAL_COMMUNITY): Payer: BC Managed Care – PPO

## 2021-07-21 ENCOUNTER — Encounter (HOSPITAL_COMMUNITY): Payer: Self-pay

## 2021-08-03 ENCOUNTER — Other Ambulatory Visit: Payer: Self-pay

## 2021-08-03 ENCOUNTER — Ambulatory Visit (HOSPITAL_COMMUNITY)
Admission: RE | Admit: 2021-08-03 | Discharge: 2021-08-03 | Disposition: A | Discharge status: Home / Self Care | Payer: BC Managed Care – PPO | Source: Ambulatory Visit | Attending: Adult Health | Admitting: Adult Health

## 2021-08-03 ENCOUNTER — Telehealth: Payer: Self-pay

## 2021-08-03 DIAGNOSIS — D0511 Intraductal carcinoma in situ of right breast: Secondary | ICD-10-CM | POA: Insufficient documentation

## 2021-08-03 MED ORDER — GADOBUTROL 1 MMOL/ML IV SOLN
7.0000 mL | Freq: Once | INTRAVENOUS | Status: AC | PRN
  Administered 2021-08-03: 7 mL via INTRAVENOUS

## 2021-08-03 NOTE — Telephone Encounter (Signed)
-----   Message from Gardenia Phlegm, NP sent at 08/03/2021  1:55 PM EST ----- Screening MRI negative for cancer please notify patient ----- Message ----- From: Interface, Rad Results In Sent: 08/03/2021  12:00 PM EST To: Gardenia Phlegm, NP

## 2021-08-03 NOTE — Telephone Encounter (Signed)
Called and left VM, per [facility], screening MRI is negative.  Instructed pt to call back with questions or concerns.

## 2021-09-29 ENCOUNTER — Encounter: Payer: Self-pay | Admitting: Hematology and Oncology

## 2021-10-04 ENCOUNTER — Encounter: Payer: Self-pay | Admitting: Adult Health

## 2021-10-04 ENCOUNTER — Other Ambulatory Visit: Payer: Self-pay

## 2021-10-04 ENCOUNTER — Inpatient Hospital Stay: Payer: BC Managed Care – PPO

## 2021-10-04 ENCOUNTER — Inpatient Hospital Stay: Payer: BC Managed Care – PPO | Attending: Adult Health | Admitting: Adult Health

## 2021-10-04 VITALS — BP 120/81 | HR 87 | Temp 97.7°F | Resp 16 | Ht 63.0 in | Wt 156.3 lb

## 2021-10-04 DIAGNOSIS — Z809 Family history of malignant neoplasm, unspecified: Secondary | ICD-10-CM | POA: Diagnosis not present

## 2021-10-04 DIAGNOSIS — B3731 Acute candidiasis of vulva and vagina: Secondary | ICD-10-CM | POA: Insufficient documentation

## 2021-10-04 DIAGNOSIS — D0511 Intraductal carcinoma in situ of right breast: Secondary | ICD-10-CM | POA: Diagnosis not present

## 2021-10-04 DIAGNOSIS — Z808 Family history of malignant neoplasm of other organs or systems: Secondary | ICD-10-CM | POA: Insufficient documentation

## 2021-10-04 DIAGNOSIS — Z170 Estrogen receptor positive status [ER+]: Secondary | ICD-10-CM | POA: Diagnosis not present

## 2021-10-04 DIAGNOSIS — Z79899 Other long term (current) drug therapy: Secondary | ICD-10-CM | POA: Diagnosis not present

## 2021-10-04 DIAGNOSIS — Z8042 Family history of malignant neoplasm of prostate: Secondary | ICD-10-CM | POA: Diagnosis not present

## 2021-10-04 DIAGNOSIS — Z9011 Acquired absence of right breast and nipple: Secondary | ICD-10-CM | POA: Insufficient documentation

## 2021-10-04 LAB — BASIC METABOLIC PANEL - CANCER CENTER ONLY
Anion gap: 6 (ref 5–15)
BUN: 12 mg/dL (ref 6–20)
CO2: 26 mmol/L (ref 22–32)
Calcium: 9.1 mg/dL (ref 8.9–10.3)
Chloride: 107 mmol/L (ref 98–111)
Creatinine: 0.59 mg/dL (ref 0.44–1.00)
GFR, Estimated: >60 mL/min (ref >60)
Glucose, Bld: 100 mg/dL — ABNORMAL HIGH (ref 70–99)
Potassium: 3.7 mmol/L (ref 3.5–5.1)
Sodium: 139 mmol/L (ref 135–145)

## 2021-10-04 LAB — HEMOGLOBIN A1C
Hgb A1c MFr Bld: 5 % (ref 4.8–5.6)
Mean Plasma Glucose: 96.8 mg/dL

## 2021-10-04 NOTE — Progress Notes (Signed)
[facility] Follow up: ?  ? ?[name], Shyrl Numbers, MD ?[address] ?[place] [address] ? ? ?DIAGNOSIS:  Cancer Staging  ?Ductal carcinoma in situ (DCIS) of right breast ?Staging form: Breast, AJCC 8th Edition ?- Clinical stage from 02/19/2020: Stage 0 (cTis (DCIS), cN0, cM0, ER+, PR+) - Signed by Nicholas Lose, MD on 02/19/2020 ?Stage prefix: Initial diagnosis ?Nuclear grade: G3 ? ? ?SUMMARY OF ONCOLOGIC HISTORY: ?Oncology History  ?Ductal carcinoma in situ (DCIS) of right breast  ?02/14/2020 Initial Diagnosis  ? Screening mammogram detected right breast calcifications spanning 1.4cm. Biopsy showed DCIS, high grade, ER+ 95%, PR+ 30%. ?  ?02/19/2020 Cancer Staging  ? Staging form: Breast, AJCC 8th Edition ?- Clinical stage from 02/19/2020: Stage 0 (cTis (DCIS), cN0, cM0, ER+, PR+) ?  ?02/25/2020 Genetic Testing  ? No pathogenic variants detected in [facility] gene panel.  The Breast/GYN Cancer Panel offered by [facility] includes sequencing and rearrangement analysis for the following 20 genes:  ATM, BARD1, BRCA1, BRCA2, BRIP1, CDH1, CHEK2, EPCAM (deletion/duplication analysis only), MLH1, MSH2, MSH6, NBN, NF1, PALB2, PMS2, PTEN, RAD51C, RAD51D, STK11 and TP53.  The report date is February 25, 2020.  ?  ?05/11/2020 Surgery  ? Right mastectomy with reconstruction [facility]) [phone]): DCIS, high grade, involving the posterior margin, 1 right axillary lymph node negative for carcinoma.  ?  ?05/2020 -  Anti-estrogen oral therapy  ? Tamoxifen. 10 mg for 1 month then increase to 20 mg. ?  ? ? ?CURRENT THERAPY: Tamoxifen daily ? ?INTERVAL HISTORY: ?Meredith [name] 51 y.o. female is here for urgent evaluation of persistent vaginal yeast infections.  Her initial infection she experienced in January after a [place] trip.  She said that she felt itchy and took 3-day over-the-counter course of Monistat.  This did not significantly improve things so she called her primary care  provider who prescribed her Diflucan over the phone.  Her symptoms improved however then worsened so she was then treated over the phone for bacterial vaginosis.  Things did not significantly improve and so she was recommended to go see her OB/GYN and despite a couple different rounds of Diflucan as well as nystatin cream she has had a persistent vaginal yeast issue. ? ?She has not undergone any lab testing and is still taking her tamoxifen at 10 mg a day. ? ? ?Patient Active Problem List  ? Diagnosis Date Noted  ? Genetic testing 02/26/2020  ? Family history of prostate cancer 02/20/2020  ? Ductal carcinoma in situ (DCIS) of right breast 02/14/2020  ? ? ?is allergic to demerol [meperidine] and phenobarbital. ? ?MEDICAL HISTORY: ?Past Medical History:  ?Diagnosis Date  ? Breast cancer ([facility]) 2021  ? right breast DCIS-mastectomy  ? Family history of prostate cancer 02/20/2020  ? Hypothyroidism   ? ? ?SURGICAL HISTORY: ?Past Surgical History:  ?Procedure Laterality Date  ? AXILLARY SENTINEL NODE BIOPSY Right 05/11/2020  ? Procedure: AXILLARY SENTINEL LYMPH NODE BIOPSY;  Surgeon: Alphonsa Overall, MD;  Location: [facility];  Service: General;  Laterality: Right;  ? BREAST RECONSTRUCTION WITH PLACEMENT OF TISSUE EXPANDER AND ALLODERM Right 05/11/2020  ? Procedure: BREAST RECONSTRUCTION WITH PLACEMENT OF TISSUE EXPANDER AND ALLODERM;  Surgeon: Irene Limbo, MD;  Location: [facility];  Service: Plastics;  Laterality: Right;  ? CESAREAN SECTION    ? MASTECTOMY W/ SENTINEL NODE BIOPSY Right 05/11/2020  ? Procedure: RIGHT MASTECTOMY;  Surgeon: Alphonsa Overall, MD;  Location: [facility];  Service: General;  Laterality: Right;  PEC BLOCK  ? MASTOPEXY Left 08/11/2020  ? Procedure: LEFT BREAST MASTOPEXY;  Surgeon: Irene Limbo, MD;  Location: [facility];  Service: Plastics;  Laterality: Left;  ? MOHS SURGERY    ? basal cell  ? REMOVAL OF TISSUE EXPANDER AND PLACEMENT OF IMPLANT Right 08/11/2020  ? Procedure: REMOVAL OF RIGHT  TISSUE EXPANDER AND PLACEMENT OF IMPLANT;  Surgeon: Irene Limbo, MD;  Location: [facility];  Service: Plastics;  Laterality: Right;  ? ? ?SOCIAL HISTORY: ?Social History  ? ?Socioeconomic History  ? Marital status: Married  ?  Spouse name: Not on file  ? Number of children: Not on file  ? Years of education: Not on file  ? Highest education level: Not on file  ?Occupational History  ? Not on file  ?Tobacco Use  ? Smoking status: Never  ? Smokeless tobacco: Never  ?Vaping Use  ? Vaping Use: Never used  ?Substance and Sexual Activity  ? Alcohol use: Yes  ?  Comment: socially  ? Drug use: Never  ? Sexual activity: Yes  ?  Birth control/protection: Post-menopausal, I.U.D.  ?Other Topics Concern  ? Not on file  ?Social History Narrative  ? Not on file  ? ?Social Determinants of Health  ? ?Financial Resource Strain: Not on file  ?Food Insecurity: Not on file  ?Transportation Needs: Not on file  ?Physical Activity: Not on file  ?Stress: Not on file  ?Social Connections: Not on file  ?Intimate Partner Violence: Not on file  ? ? ?FAMILY HISTORY: ?Family History  ?Problem Relation Age of Onset  ? Prostate cancer Maternal Grandfather   ? Skin cancer Maternal Grandfather   ? Cancer Maternal Aunt   ?     dx mid 63s; GYN cancer  ? ? ?Review of Systems  ?Constitutional:  Negative for appetite change, chills, fatigue, fever and unexpected weight change.  ?HENT:   Negative for hearing loss, lump/mass and trouble swallowing.   ?Eyes:  Negative for eye problems and icterus.  ?Respiratory:  Negative for chest tightness, cough and shortness of breath.   ?Cardiovascular:  Negative for chest pain, leg swelling and palpitations.  ?Gastrointestinal:  Negative for abdominal distention, abdominal pain, constipation, diarrhea, nausea and vomiting.  ?Endocrine: Negative for hot flashes.  ?Genitourinary:  Positive for vaginal discharge. Negative for difficulty urinating.   ?Musculoskeletal:  Negative for arthralgias.  ?Skin:   Negative for itching and rash.  ?Neurological:  Negative for dizziness, extremity weakness, headaches and numbness.  ?Hematological:  Negative for adenopathy. Does not bruise/bleed easily.  ?Psychiatric/Behavioral:  Negative for depression. The patient is not nervous/anxious.    ? ? ?PHYSICAL EXAMINATION ? ?ECOG PERFORMANCE STATUS: 1 - Symptomatic but completely ambulatory ? ?Vitals:  ? 10/04/21 1459  ?BP: 120/81  ?Pulse: 87  ?Resp: 16  ?Temp: 97.7 ?F (36.5 ?C)  ?SpO2: 100%  ? ? ?Physical Exam ?Constitutional:   ?   General: She is not in acute distress. ?   Appearance: Normal appearance. She is not toxic-appearing.  ?HENT:  ?   Head: Normocephalic and atraumatic.  ?Eyes:  ?   General: No scleral icterus. ?Pulmonary:  ?   Effort: Pulmonary effort is normal.  ?Musculoskeletal:     ?   General: No swelling.  ?Skin: ?   General: Skin is dry.  ?   Findings: No rash.  ?Neurological:  ?   General: No focal deficit present.  ?   Mental Status: She is alert.  ?Psychiatric:     ?  Mood and Affect: Mood normal.     ?   Behavior: Behavior normal.  ? ? ?LABORATORY DATA: ? ?CBC ?   ?Component Value Date/Time  ? WBC 4.5 05/07/2020 1348  ? RBC 4.43 05/07/2020 1348  ? HGB 13.2 05/07/2020 1348  ? HGB 14.0 02/19/2020 0828  ? HCT 40.9 05/07/2020 1348  ? PLT 250 05/07/2020 1348  ? PLT 263 02/19/2020 0828  ? MCV 92.3 05/07/2020 1348  ? MCH 29.8 05/07/2020 1348  ? MCHC 32.3 05/07/2020 1348  ? RDW 11.4 (L) 05/07/2020 1348  ? LYMPHSABS 1.5 02/19/2020 0828  ? MONOABS 0.5 02/19/2020 0828  ? EOSABS 0.1 02/19/2020 0828  ? BASOSABS 0.1 02/19/2020 0828  ? ? ?CMP  ?   ?Component Value Date/Time  ? NA 140 02/19/2020 0828  ? K 4.6 02/19/2020 0828  ? CL 105 02/19/2020 0828  ? CO2 28 02/19/2020 0828  ? GLUCOSE 87 02/19/2020 0828  ? BUN 14 02/19/2020 0828  ? CREATININE 0.83 02/19/2020 0828  ? CALCIUM 10.5 (H) 02/19/2020 3448  ? PROT 7.5 02/19/2020 0828  ? ALBUMIN 4.4 02/19/2020 0828  ? AST 22 02/19/2020 0828  ? ALT 16 02/19/2020 0828  ? ALKPHOS 74  02/19/2020 0828  ? BILITOT 0.7 02/19/2020 0828  ? GFRNONAA >60 02/19/2020 0828  ? GFRAA >60 02/19/2020 0828  ? ? ? ? ? ? ? ?ASSESSMENT and THERAPY PLAN:  ? ?Ductal carcinoma in situ (DCIS) of right breast ?8

## 2021-10-04 NOTE — Assessment & Plan Note (Signed)
02/14/2020:Screening mammogram detected right breast calcifications spanning 1.4cm. Biopsy showed DCIS, high grade, ER+ 95%, PR+ 30%.??Tis NX stage 0 ?? ?05/11/2020: Rt Mastectomy: HG DCIS spans 7 cm. Focal Pos Posterior margin. ER/ PR Pos ?Treatment Plan: Adjuvant Tamoxifen (we will start her at 10 mg daily for 1 month starting 06/03/2020.  If she tolerates it well then we will increase it to 20 mg daily) ?? ?------------------------------------------------------------------------------------------------------------------------------------------------------ ? ?Meredith [name] is here today for urgent evaluation of vaginal candida.  She has had quite a difficult time with this.  It very well could be the tamoxifen causing this side effect and I have recommended that she stop taking the tamoxifen for 4 weeks to see if it will improve.  We are also going to check a hemoglobin A1c and BMP today to your weight other etiologies of recurrent yeast infections. ? ?If her yeast infections do not start to resolve she will need to see an infectious disease specialist. ? ?I reviewed the above with [name] in detail and she is in agreement with the assessment and plan.  She will follow-up with me in 4 weeks virtually to discuss her symptoms and how she is doing. ?

## 2021-10-05 ENCOUNTER — Telehealth: Payer: Self-pay | Admitting: Adult Health

## 2021-10-05 ENCOUNTER — Telehealth: Payer: Self-pay

## 2021-10-05 NOTE — Telephone Encounter (Signed)
-----   Message from Gardenia Phlegm, NP sent at 10/05/2021  8:21 AM EDT ----- ?Please call patient and let her know her labs were good no diabetes which can lead to frequent yeast infections. ?----- Message ----- ?From: Interface, Lab In [place] ?Sent: 10/04/2021   4:21 PM EDT ?To: Gardenia Phlegm, NP ? ? ?

## 2021-10-05 NOTE — Telephone Encounter (Signed)
Left message with follow-up appointment per 4/17 los. ?

## 2021-10-11 ENCOUNTER — Other Ambulatory Visit: Payer: Self-pay

## 2021-10-11 DIAGNOSIS — B3731 Acute candidiasis of vulva and vagina: Secondary | ICD-10-CM

## 2021-10-11 NOTE — Progress Notes (Signed)
Referral placed to inf disease per Mendel Ryder ?

## 2021-10-15 ENCOUNTER — Ambulatory Visit (INDEPENDENT_AMBULATORY_CARE_PROVIDER_SITE_OTHER): Payer: BC Managed Care – PPO | Admitting: Internal Medicine

## 2021-10-15 ENCOUNTER — Encounter: Payer: Self-pay | Admitting: Internal Medicine

## 2021-10-15 ENCOUNTER — Other Ambulatory Visit: Payer: Self-pay

## 2021-10-15 VITALS — BP 126/84 | HR 84 | Temp 98.6°F | Ht 63.0 in | Wt 157.0 lb

## 2021-10-15 DIAGNOSIS — N898 Other specified noninflammatory disorders of vagina: Secondary | ICD-10-CM | POA: Diagnosis not present

## 2021-10-15 MED ORDER — FLUCONAZOLE 200 MG PO TABS: 200.0000 mg | ORAL_TABLET | Freq: Every day | ORAL | 3 refills | Status: DC

## 2021-10-15 NOTE — Patient Instructions (Signed)
Your symptoms are a little atypical for vaginal yeast infection. Tamoxifen does put her at risk for recurrent yeast infection, and antifungal seems to improve sx, so maybe this could be. We therefore needs firm diagnosis with at least wet prep.   ? ?While resistant yeast vaginal infection is rare for her population, it is not unheard off such as candida glabrata.  ? ? ?What we will do: ?--------------- ?Will send urine culture asking to grow for yeast if lab see ?Will also need to talk to her gynecologist (dr Stann Mainland with [facility]) to see what he had done and request if he could reswab and send wetprep/culture as well ? ? ? ?Try fluconazole 200 mg every 72 hours for 3 more doses and remain on one tablet once a week for now. Hopefully the longer we are from tamoxifen cessation the better it gets, while we await microbiologic workup ? ?See me back in 4 weeks or sooner if more severe sx not improved ?

## 2021-10-15 NOTE — Progress Notes (Addendum)
?  ? ? ? ? ?[facility] for Infectious Disease ? ?Reason for Consult:recurrent yeast infection ?Referring Provider: [name] ? ? ? ?Patient Active Problem List  ? Diagnosis Date Noted  ? Genetic testing 02/26/2020  ? Family history of prostate cancer 02/20/2020  ? Ductal carcinoma in situ (DCIS) of right breast 02/14/2020  ? ? ? ? ?HPI: Meredith [name] is a 51 y.o. female referred here for recurrent yeast infection ? ?She reports was at [place] [facility] 06/2021 when she notice burning/rawness in her private area. Her PCP gave her diflucan 3 doses. It did help some 10-->4 on scale of 10, but a week later sx became severe again ? ?Patient ultimately seen her obgyn who got 7 day of diflucan mid 07/2021. She thought it got a lot better where she can be intimate again; no actual pap was done. However, sx returned 2 weeks later by mid march 2023. Pap was done and some swab was done. She was told "she was swollen red." ? ?She had frequency of urination as well but never vaginal discharge. She was then told to go to her cancer doc about tamoxifen (she has been on a year) ? ?She has been on tamoxifen, but off 2 weeks prior to this clinic. Starting to have hot flashes again but nothing changed in terms of vaginal sx ? ?She is on q48hr diflucan (3rd dose now by this visit). This is her 5th round of diflucan. At one time she was taking brexafemme -- 4/13 and 4/17 with improvement for a week then returns.  ? ?At this time she still have moderate urinary frequency, burning in vaginal area, pain with sex, and itching around perirectal area. ? ?She is also using nystatin cream  ? ?No fever, chill ?No rash in groin/perirectal area. ? ? ? ?Review of Systems: ?ROS ?All other ros negative ? ? ? ? ? ? ?Past Medical History:  ?Diagnosis Date  ? Breast cancer ([facility]) 2021  ? right breast DCIS-mastectomy  ? Family history of prostate cancer 02/20/2020  ? Hypothyroidism   ? ? ?Social History  ? ?Tobacco Use  ? Smoking status: Never  ? Smokeless  tobacco: Never  ?Vaping Use  ? Vaping Use: Never used  ?Substance Use Topics  ? Alcohol use: Yes  ?  Comment: socially  ? Drug use: Never  ? ? ?Family History  ?Problem Relation Age of Onset  ? Prostate cancer Maternal Grandfather   ? Skin cancer Maternal Grandfather   ? Cancer Maternal Aunt   ?     dx mid 60s; GYN cancer  ? ? ?Allergies  ?Allergen Reactions  ? Demerol [Meperidine] Nausea And Vomiting  ? Phenobarbital Other (See Comments)  ?  As a baby, made her hyperactive  ? ? ?OBJECTIVE: ?Vitals:  ? 10/15/21 1040  ?BP: 126/84  ?Pulse: 84  ?Temp: 98.6 ?F (37 ?C)  ?TempSrc: Oral  ?SpO2: 100%  ?Weight: 157 lb (71.2 kg)  ?Height: '5\' 3"'$  (1.6 m)  ? ?Body mass index is 27.81 kg/m?. ? ? ?Physical Exam ?General/constitutional: no distress, pleasant ?HEENT: Normocephalic, PER, Conj Clear, EOMI, Oropharynx clear ?Neck supple ?CV: rrr no mrg ?Lungs: clear to auscultation, normal respiratory effort ?Abd: Soft, Nontender ?Ext: no edema ?Skin: No Rash ?Neuro: nonfocal ?MSK: no peripheral joint swelling/tenderness/warmth; back spines nontender ? ? ?Lab: ? ?Microbiology: ? ?Serology: ? ?Imaging: ? ? ?Assessment/plan: ?Problem List Items Addressed This Visit   ?None ?Visit Diagnoses   ? ? Vaginal itching    -  Primary  ? Relevant Orders  ? Urine Microscopic Only  ? Urine Culture  ? ?  ? ? ? ? ?Patient with 51 yo female on tamoxifen for breast cancer, referred by pcp/obgyn for concern of possible yeast infection ? ? ?She desribes burning/vaginal itching without discharge, and also urinary frequency. This is a little atypical for vaginal yeast infection. Tamoxifen does put her at risk for recurrent yeast infection, and antifungal seems to improve sx, so maybe this could be. We therefore needs firm diagnosis with at least wet prep.   ? ?While resistant yeast vaginal infection is rare for her population, it is not unheard off such as candida glabrata.  ? ?Will send urine culture asking to grow for yeast if lab see ?Will also need to  talk to her gynecologist (dr Stann Mainland with [facility]) to see what he had done and request if he could reswab and send wetprep/culture as well ? ?Dr Stann Mainland [phone] ? ?Try fluconazole 200 mg every 72 hours for 3 more doses and remain on one tablet once a week for now. Hopefully the longer we are from tamoxifen cessation the better it gets, while we await microbiologic workup ? ?See me back in 4 weeks or sooner if more severe sx not improved ? ? ?---------- ?Addendum received obgyn record from [facility] post visit; reviewed ?-09/23/21 office visit. Used nystatin-triamcinolone last night; mirena iud removed 01/2021; on tamoxifen. Restart fluconazole 150 mg qod. Vaginal sampling but only pH testing reported ?-08/12/21 office visit. Vulva swelling noted. Ph recorded. No other lab reported. Given fluconazole 4 doses ? ?After reviewing the charts I am not sure any actual wetprep testing was done ? ? ?Follow-up: Return in about 4 weeks (around 11/12/2021). ? ?Jabier Mutton, MD ?[facility] ?[place] ?[phone] pager   [phone] cell ?10/15/2021, 10:51 AM ? ?

## 2021-10-19 LAB — URINALYSIS, MICROSCOPIC ONLY
Bacteria, UA: NONE SEEN /HPF
Hyaline Cast: NONE SEEN /LPF
RBC / HPF: NONE SEEN /HPF (ref 0–2)
Squamous Epithelial / HPF: NONE SEEN /HPF (ref <=5)
WBC, UA: NONE SEEN /HPF (ref 0–5)

## 2021-10-19 LAB — URINE CULTURE
MICRO NUMBER:: 13328576
Result:: NO GROWTH
SPECIMEN QUALITY:: ADEQUATE

## 2021-11-01 ENCOUNTER — Inpatient Hospital Stay: Payer: BC Managed Care – PPO | Attending: Adult Health | Admitting: Adult Health

## 2021-11-01 DIAGNOSIS — D0511 Intraductal carcinoma in situ of right breast: Secondary | ICD-10-CM

## 2021-11-01 NOTE — Progress Notes (Signed)
[facility]: ?  ? ?[name], Meredith Numbers, MD ?[address] ?[place] [address] ? ? ?DIAGNOSIS:  Cancer Staging  ?Ductal carcinoma in situ (DCIS) of right breast ?Staging form: Breast, AJCC 8th Edition ?- Clinical stage from 02/19/2020: Stage 0 (cTis (DCIS), cN0, cM0, ER+, PR+) - Signed by Meredith Lose, MD on 02/19/2020 ?Stage prefix: Initial diagnosis ?Nuclear grade: G3 ? ?I connected with Meredith [name] on 11/01/21 at  9:15 AM EDT by telephone and verified that I am speaking with the correct person using two identifiers.  ?I discussed the limitations, risks, security and privacy concerns of performing an evaluation and management service by telephone and the availability of in person appointments.  ?I also discussed with the patient that there may be a patient responsible charge related to this service. The patient expressed understanding and agreed to proceed.  ?Patient location: on vacation in [place] ?Provider location: [facility] in office ?Others participating: none ? ?SUMMARY OF ONCOLOGIC HISTORY: ?Oncology History  ?Ductal carcinoma in situ (DCIS) of right breast  ?02/14/2020 Initial Diagnosis  ? Screening mammogram detected right breast calcifications spanning 1.4cm. Biopsy showed DCIS, high grade, ER+ 95%, PR+ 30%. ?  ?02/19/2020 Cancer Staging  ? Staging form: Breast, AJCC 8th Edition ?- Clinical stage from 02/19/2020: Stage 0 (cTis (DCIS), cN0, cM0, ER+, PR+) ?  ?02/25/2020 Genetic Testing  ? No pathogenic variants detected in [facility] gene panel.  The Breast/GYN Cancer Panel offered by [facility] includes sequencing and rearrangement analysis for the following 20 genes:  ATM, BARD1, BRCA1, BRCA2, BRIP1, CDH1, CHEK2, EPCAM (deletion/duplication analysis only), MLH1, MSH2, MSH6, NBN, NF1, PALB2, PMS2, PTEN, RAD51C, RAD51D, STK11 and TP53.  The report date is February 25, 2020.  ?  ?05/11/2020 Surgery  ? Right mastectomy with reconstruction [name])  [phone]): DCIS, high grade, involving the posterior margin, 1 right axillary lymph node negative for carcinoma.  ?  ?05/2020 -  Anti-estrogen oral therapy  ? Tamoxifen. 10 mg for 1 month then increase to 20 mg. ?  ? ? ?CURRENT THERAPY: Tamoxifen ? ?INTERVAL HISTORY: ?Meredith [name] 51 y.o. female returns for follow up of her recurrent yeast infections.  We took her off of tamoxifen and she notes she is starting to feel improved.  She has been following with infectious disease and gynecology and notes that the chronic yeast is beginning to resolve however she does have some vulvar swelling and irritation and dryness that has started.  Her gynecologist has suggested using a small amount of estrogen cream to help replenish moisture to get things back to normal.   ? ? ?Patient Active Problem List  ? Diagnosis Date Noted  ? Genetic testing 02/26/2020  ? Family history of prostate cancer 02/20/2020  ? Ductal carcinoma in situ (DCIS) of right breast 02/14/2020  ? ? ?is allergic to demerol [meperidine] and phenobarbital. ? ?MEDICAL HISTORY: ?Past Medical History:  ?Diagnosis Date  ? Breast cancer ([place]) 2021  ? right breast DCIS-mastectomy  ? Family history of prostate cancer 02/20/2020  ? Hypothyroidism   ? ? ?SURGICAL HISTORY: ?Past Surgical History:  ?Procedure Laterality Date  ? AXILLARY SENTINEL NODE BIOPSY Right 05/11/2020  ? Procedure: AXILLARY SENTINEL LYMPH NODE BIOPSY;  Surgeon: Meredith Overall, MD;  Location: [facility];  Service: General;  Laterality: Right;  ? BREAST RECONSTRUCTION WITH PLACEMENT OF TISSUE EXPANDER AND ALLODERM Right 05/11/2020  ? Procedure: BREAST RECONSTRUCTION WITH PLACEMENT OF TISSUE EXPANDER AND ALLODERM;  Surgeon: Meredith Limbo, MD;  Location: [facility];  Service: Plastics;  Laterality: Right;  ? CESAREAN SECTION    ? MASTECTOMY W/ SENTINEL NODE BIOPSY Right 05/11/2020  ? Procedure: RIGHT MASTECTOMY;  Surgeon: Meredith Overall, MD;  Location: [place];  Service: General;  Laterality: Right;  PEC  BLOCK  ? MASTOPEXY Left 08/11/2020  ? Procedure: LEFT BREAST MASTOPEXY;  Surgeon: Meredith Limbo, MD;  Location: [place] [facility];  Service: Plastics;  Laterality: Left;  ? MOHS SURGERY    ? basal cell  ? REMOVAL OF TISSUE EXPANDER AND PLACEMENT OF IMPLANT Right 08/11/2020  ? Procedure: REMOVAL OF RIGHT TISSUE EXPANDER AND PLACEMENT OF IMPLANT;  Surgeon: Meredith Limbo, MD;  Location: [facility];  Service: Plastics;  Laterality: Right;  ? ? ?SOCIAL HISTORY: ?Social History  ? ?Socioeconomic History  ? Marital status: Married  ?  Spouse name: Not on file  ? Number of children: Not on file  ? Years of education: Not on file  ? Highest education level: Not on file  ?Occupational History  ? Not on file  ?Tobacco Use  ? Smoking status: Never  ? Smokeless tobacco: Never  ?Vaping Use  ? Vaping Use: Never used  ?Substance and Sexual Activity  ? Alcohol use: Yes  ?  Comment: socially  ? Drug use: Never  ? Sexual activity: Yes  ?  Birth control/protection: Post-menopausal, I.U.D.  ?Other Topics Concern  ? Not on file  ?Social History Narrative  ? Not on file  ? ?Social Determinants of Health  ? ?Financial Resource Strain: Not on file  ?Food Insecurity: Not on file  ?Transportation Needs: Not on file  ?Physical Activity: Not on file  ?Stress: Not on file  ?Social Connections: Not on file  ?Intimate Partner Violence: Not on file  ? ? ?FAMILY HISTORY: ?Family History  ?Problem Relation Age of Onset  ? Prostate cancer Maternal Grandfather   ? Skin cancer Maternal Grandfather   ? Cancer Maternal Aunt   ?     dx mid 84s; GYN cancer  ? ? ?Review of Systems  ?Constitutional:  Negative for appetite change, chills, fatigue, fever and unexpected weight change.  ?HENT:   Negative for hearing loss, lump/mass and trouble swallowing.   ?Eyes:  Negative for eye problems and icterus.  ?Respiratory:  Negative for chest tightness, cough and shortness of breath.   ?Cardiovascular:  Negative for chest pain, leg  swelling and palpitations.  ?Gastrointestinal:  Negative for abdominal distention, abdominal pain, constipation, diarrhea, nausea and vomiting.  ?Endocrine: Negative for hot flashes.  ?Genitourinary:  Negative for difficulty urinating.   ?Musculoskeletal:  Negative for arthralgias.  ?Skin:  Negative for itching and rash.  ?Neurological:  Negative for dizziness, extremity weakness, headaches and numbness.  ?Hematological:  Negative for adenopathy. Does not bruise/bleed easily.  ?Psychiatric/Behavioral:  Negative for depression. The patient is not nervous/anxious.    ? ? ?PHYSICAL EXAMINATION ? ?ECOG PERFORMANCE STATUS: 1 - Symptomatic but completely ambulatory ? ? ? ? ? ?LABORATORY DATA: ? ?CBC ?   ?Component Value Date/Time  ? WBC 4.5 05/07/2020 1348  ? RBC 4.43 05/07/2020 1348  ? HGB 13.2 05/07/2020 1348  ? HGB 14.0 02/19/2020 0828  ? HCT 40.9 05/07/2020 1348  ? PLT 250 05/07/2020 1348  ? PLT 263 02/19/2020 0828  ? MCV 92.3 05/07/2020 1348  ? MCH 29.8 05/07/2020 1348  ? MCHC 32.3 05/07/2020 1348  ? RDW 11.4 (L) 05/07/2020 1348  ? LYMPHSABS 1.5 02/19/2020 0828  ? MONOABS 0.5 02/19/2020  2197  ? EOSABS 0.1 02/19/2020 0828  ? BASOSABS 0.1 02/19/2020 0828  ? ? ?CMP  ?   ?Component Value Date/Time  ? NA 139 10/04/2021 1525  ? K 3.7 10/04/2021 1525  ? CL 107 10/04/2021 1525  ? CO2 26 10/04/2021 1525  ? GLUCOSE 100 (H) 10/04/2021 1525  ? BUN 12 10/04/2021 1525  ? CREATININE 0.59 10/04/2021 1525  ? CALCIUM 9.1 10/04/2021 1525  ? PROT 7.5 02/19/2020 0828  ? ALBUMIN 4.4 02/19/2020 0828  ? AST 22 02/19/2020 0828  ? ALT 16 02/19/2020 0828  ? ALKPHOS 74 02/19/2020 0828  ? BILITOT 0.7 02/19/2020 0828  ? GFRNONAA >60 10/04/2021 1525  ? GFRAA >60 02/19/2020 0828  ? ? ? ? ?ASSESSMENT and THERAPY PLAN:  ? ?Ductal carcinoma in situ (DCIS) of right breast ?02/14/2020:Screening mammogram detected right breast calcifications spanning 1.4cm. Biopsy showed DCIS, high grade, ER+ 95%, PR+ 30%.  Tis NX stage 0 ?  ?05/11/2020: Rt Mastectomy: HG  DCIS spans 7 cm. Focal Pos Posterior margin. ER/ PR Pos ?Treatment Plan: Adjuvant Tamoxifen (we will start her at 10 mg daily for 1 month starting 06/03/2020.  If she tolerates it well then we will increase it to 20 mg da

## 2021-11-01 NOTE — Assessment & Plan Note (Signed)
02/14/2020:Screening mammogram detected right breast calcifications spanning 1.4cm. Biopsy showed DCIS, high grade, ER+ 95%, PR+ 30%.??Tis NX stage 0 ?? ?05/11/2020: Rt Mastectomy: HG DCIS spans 7 cm. Focal Pos Posterior margin. ER/ PR Pos ?Treatment Plan: Adjuvant Tamoxifen (we will start her at 10 mg daily for 1 month starting 06/03/2020.  If she tolerates it well then we will increase it to 20 mg daily) ?? ?------------------------------------------------------------------------------------------------------------------------------------------------------ ? ?Paisley's recurrent yeast infections are starting to improve.  She is following up with gynecology and her infectious disease specialist.  I let her know that she could do a very small amount of estrogen cream 3 times a week.  It would be great if she did not require that long-term.  We reviewed that we do not have any evidence that vaginal estrogen is not systemically absorbed, however we also do not have any evidence that it is systemically absorbed. ? ?Orilla will continue to follow-up with gynecology and we will see her back in October 2023 for her follow-up with Dr. Lindi Adie.  I recommended she stay off tamoxifen for that time. ?

## 2021-11-16 ENCOUNTER — Other Ambulatory Visit: Payer: Self-pay

## 2021-11-16 ENCOUNTER — Ambulatory Visit (INDEPENDENT_AMBULATORY_CARE_PROVIDER_SITE_OTHER): Payer: BC Managed Care – PPO | Admitting: Internal Medicine

## 2021-11-16 ENCOUNTER — Encounter: Payer: Self-pay | Admitting: Internal Medicine

## 2021-11-16 DIAGNOSIS — T887XXA Unspecified adverse effect of drug or medicament, initial encounter: Secondary | ICD-10-CM

## 2021-11-16 NOTE — Progress Notes (Signed)
[facility] Infectious Disease  Reason for Consult:recurrent yeast infection Referring Provider: [name]    Patient Active Problem List   Diagnosis Date Noted   Genetic testing 02/26/2020   Family history of prostate cancer 02/20/2020   Ductal carcinoma in situ (DCIS) of right breast 02/14/2020      HPI: CHALONDA SCHLATTER is a 51 y.o. female referred here for recurrent yeast infection  She reports was at [place] 06/2021 when she notice burning/rawness in her private area. Her PCP gave her diflucan 3 doses. It did help some 10-->4 on scale of 10, but a week later sx became severe again  Patient ultimately seen her obgyn who got 7 day of diflucan mid 07/2021. She thought it got a lot better where she can be intimate again; no actual pap was done. However, sx returned 2 weeks later by mid march 2023. Pap was done and some swab was done. She was told "she was swollen red."  She had frequency of urination as well but never vaginal discharge. She was then told to go to her cancer doc about tamoxifen (she has been on a year)  She has been on tamoxifen, but off 2 weeks prior to this clinic. Starting to have hot flashes again but nothing changed in terms of vaginal sx  She is on q48hr diflucan (3rd dose now by this visit). This is her 5th round of diflucan. At one time she was taking brexafemme -- 4/13 and 4/17 with improvement for a week then returns.   At this time she still have moderate urinary frequency, burning in vaginal area, pain with sex, and itching around perirectal area.  She is also using nystatin cream   No fever, chill No rash in groin/perirectal area.   -------------- 11/16/21 id follow up    I verified that I was speaking with the correct person using two identifiers. Due to the COVID-19 Pandemic/patient's desire, this service was provided via telemedicine using audio/visual media.   The patient was located at home. The provider was located  in the office. The patient did consent to this visit and is aware of charges through their insurance as well as the limitations of evaluation and management by telemedicine. Other persons participating in this telemedicine service were none. Time spent on visit was greater than 15 minutes on media and in coordination of care  Patient off tamoxifen about 5 weeks She had seen her obgyn doc who performed a wetprep of vaginal fluid and didn't see any fungal element We had started fluconazole treatment during last visit with prophylaxis weekly dose but had stopped 2 weeks prior to this visit after patient updated [place] with the obgyn result  She is doing better. No sx of itch/discomfort at this time. No vaginal discharge. No dyspareunia.      Review of Systems: ROS All other ros negative       Past Medical History:  Diagnosis Date   Breast cancer ([place]) 2021   right breast DCIS-mastectomy   Family history of prostate cancer 02/20/2020   Hypothyroidism     Social History   Tobacco Use   Smoking status: Never   Smokeless tobacco: Never  Vaping Use   Vaping Use: Never used  Substance Use Topics   Alcohol use: Yes    Comment: socially   Drug use: Never    Family History  Problem Relation Age of Onset   Prostate cancer Maternal Grandfather  Skin cancer Maternal Grandfather    Cancer Maternal Aunt        dx mid 75s; GYN cancer    Allergies  Allergen Reactions   Demerol [Meperidine] Nausea And Vomiting   Phenobarbital Other (See Comments)    As a baby, made her hyperactive    OBJECTIVE: There were no vitals filed for this visit.  There is no height or weight on file to calculate BMI.  televisit   Lab:  Microbiology:  Serology:  Imaging:   Assessment/plan: Problem List Items Addressed This Visit   None    Patient with 51 yo female on tamoxifen for breast cancer, referred by pcp/obgyn for concern of possible yeast infection   She desribes  burning/vaginal itching without discharge, and also urinary frequency. This is a little atypical for vaginal yeast infection. Tamoxifen does put her at risk for recurrent yeast infection, and antifungal seems to improve sx, so maybe this could be. We therefore needs firm diagnosis with at least wet prep.    While resistant yeast vaginal infection is rare for her population, it is not unheard off such as candida glabrata.   Will send urine culture asking to grow for yeast if lab see Will also need to talk to her gynecologist (dr Stann Mainland with [facility]) to see what he had done and request if he could reswab and send wetprep/culture as well  Dr Stann Mainland [phone]  Try fluconazole 200 mg every 72 hours for 3 more doses and remain on one tablet once a week for now. Hopefully the longer we are from tamoxifen cessation the better it gets, while we await microbiologic workup  See me back in 4 weeks or sooner if more severe sx not improved   ---------- Addendum received obgyn record from [facility] post visit; reviewed -09/23/21 office visit. Used nystatin-triamcinolone last night; mirena iud removed 01/2021; on tamoxifen. Restart fluconazole 150 mg qod. Vaginal sampling but only pH testing reported -08/12/21 office visit. Vulva swelling noted. Ph recorded. No other lab reported. Given fluconazole 4 doses  After reviewing the charts I am not sure any actual wetprep testing was done ------------- 11/16/21 assessment Negative wetprep exam from her obgyn Off fluconazole 2 weeks prior Off tamoxifen 5 weeks prior Sx resolved  It appears this was due to tamoxifen side effect   Follow up as needed   Follow-up: Return if symptoms worsen or fail to improve.  Jabier Mutton, [place] [facility] [place] [phone] pager   [phone] cell 11/16/2021, 10:16 AM

## 2022-01-13 ENCOUNTER — Other Ambulatory Visit: Payer: Self-pay | Admitting: Family Medicine

## 2022-01-13 DIAGNOSIS — Z1231 Encounter for screening mammogram for malignant neoplasm of breast: Secondary | ICD-10-CM

## 2022-01-20 ENCOUNTER — Other Ambulatory Visit: Payer: Self-pay | Admitting: Plastic Surgery

## 2022-01-20 DIAGNOSIS — N6310 Unspecified lump in the right breast, unspecified quadrant: Secondary | ICD-10-CM

## 2022-01-20 IMAGING — MG DIGITAL DIAGNOSTIC UNILAT RIGHT W/ CAD
3 series · 3 of 3 positions shown · non-contrast
Comparison: Previous exam(s).

CLINICAL DATA: The patient was called back for right breast
calcifications

EXAM:
DIGITAL DIAGNOSTIC RIGHT MAMMOGRAM

[R ML (1 of 2)]
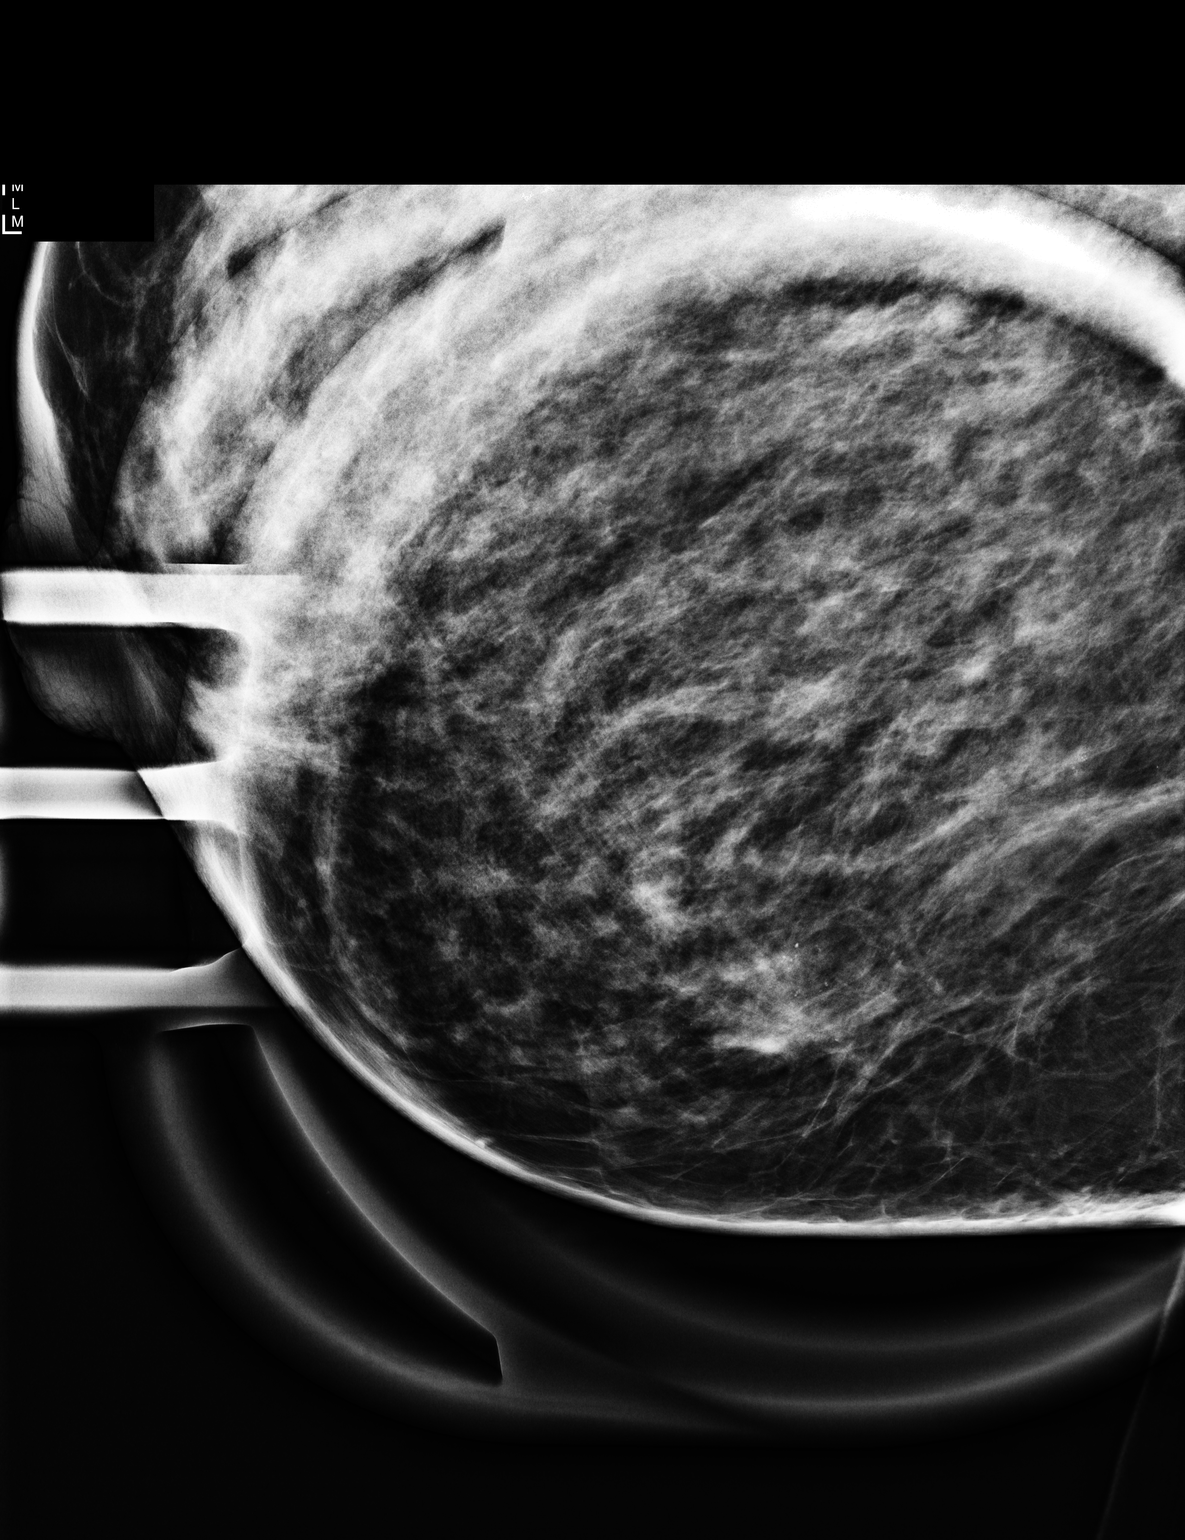

[R ML (2 of 2)]
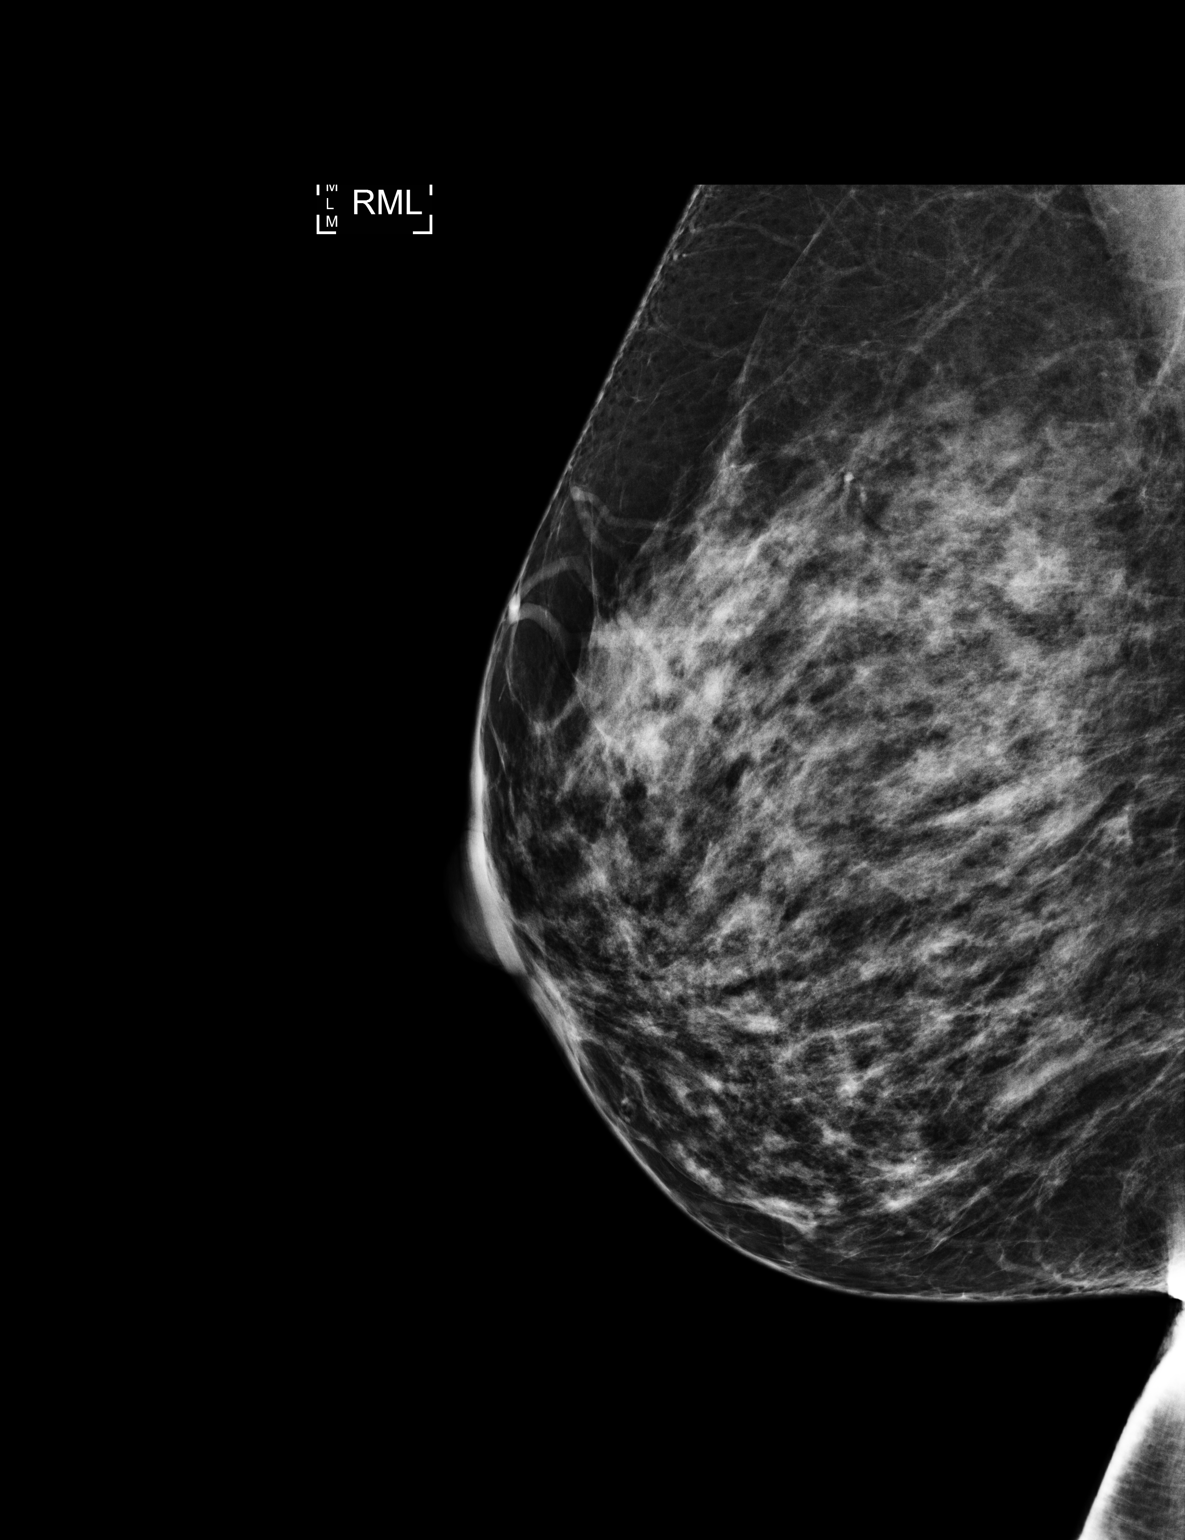

[R CC]
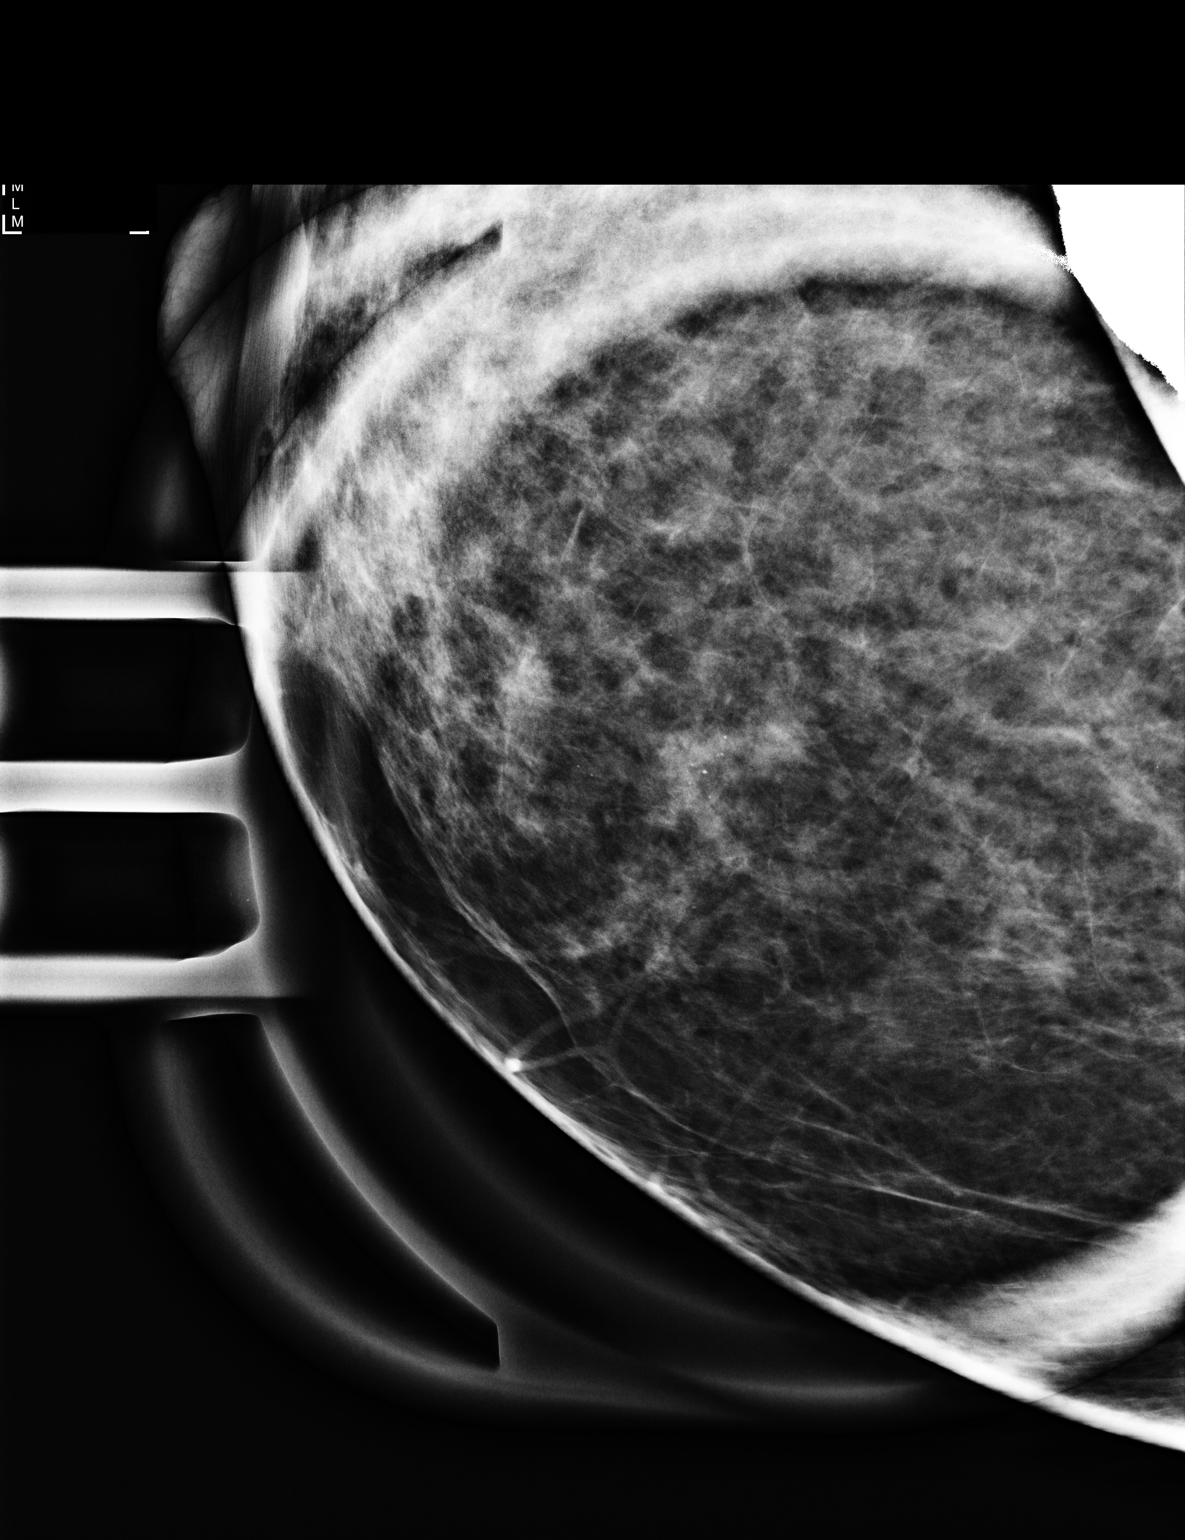

[3 of 3 positions shown; findings below may reference images not displayed]

ACR Breast Density Category c: The breast tissue is heterogeneously
dense, which may obscure small masses.
FINDINGS: There is a new group of calcifications in the medial inferior right
breast spanning 1.4 cm. These calcifications are indeterminate and
do not layer.
IMPRESSION: Indeterminate right breast calcifications.

RECOMMENDATION:
Recommend stereotactic biopsy of the right breast calcifications.

I have discussed the findings and recommendations with the patient.
If applicable, a reminder letter will be sent to the patient
regarding the next appointment.

BI-RADS CATEGORY  4: Suspicious.

## 2022-01-26 ENCOUNTER — Other Ambulatory Visit: Payer: Self-pay | Admitting: Plastic Surgery

## 2022-01-26 ENCOUNTER — Ambulatory Visit
Admission: RE | Admit: 2022-01-26 | Discharge: 2022-01-26 | Disposition: A | Discharge status: Home / Self Care | Payer: BC Managed Care – PPO | Source: Ambulatory Visit | Attending: Plastic Surgery | Admitting: Plastic Surgery

## 2022-01-26 DIAGNOSIS — N6310 Unspecified lump in the right breast, unspecified quadrant: Secondary | ICD-10-CM

## 2022-02-09 ENCOUNTER — Ambulatory Visit: Payer: BC Managed Care – PPO

## 2022-02-14 IMAGING — CR DG STERNUM 2+V
3 series · 3 of 3 positions shown · non-contrast
Comparison: None.

CLINICAL DATA: Bony lesion seen on breast MRI.

EXAM:
STERNUM - 2+ VIEW

[w chest pa]
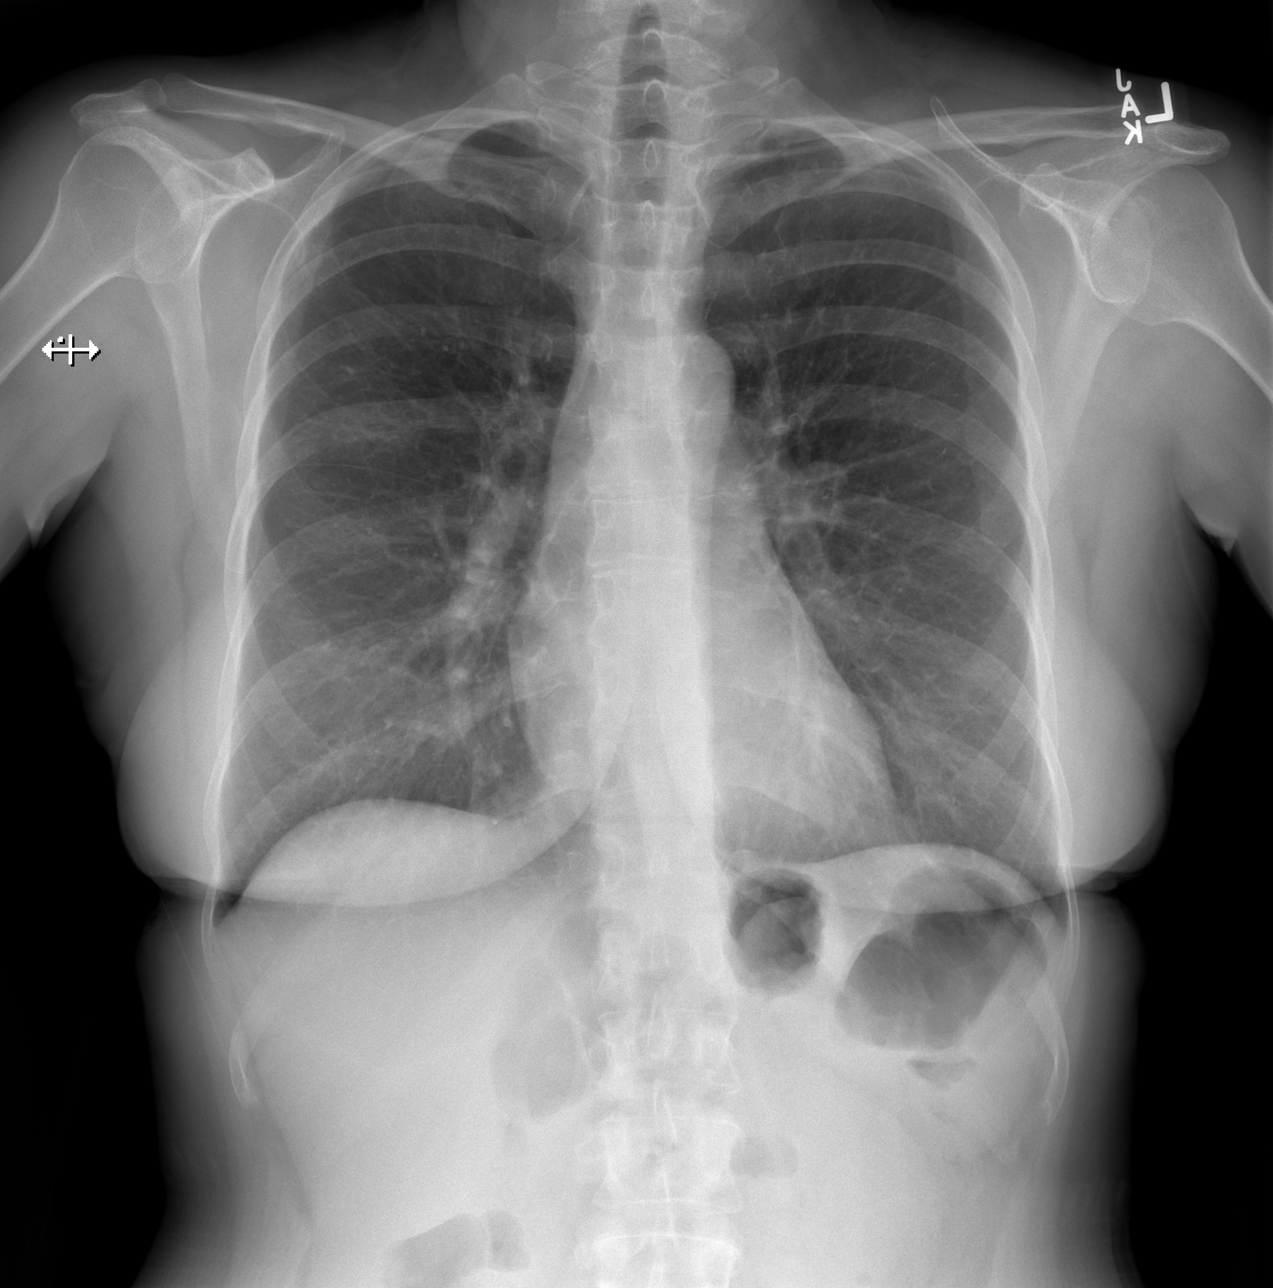

[w sternum lat]
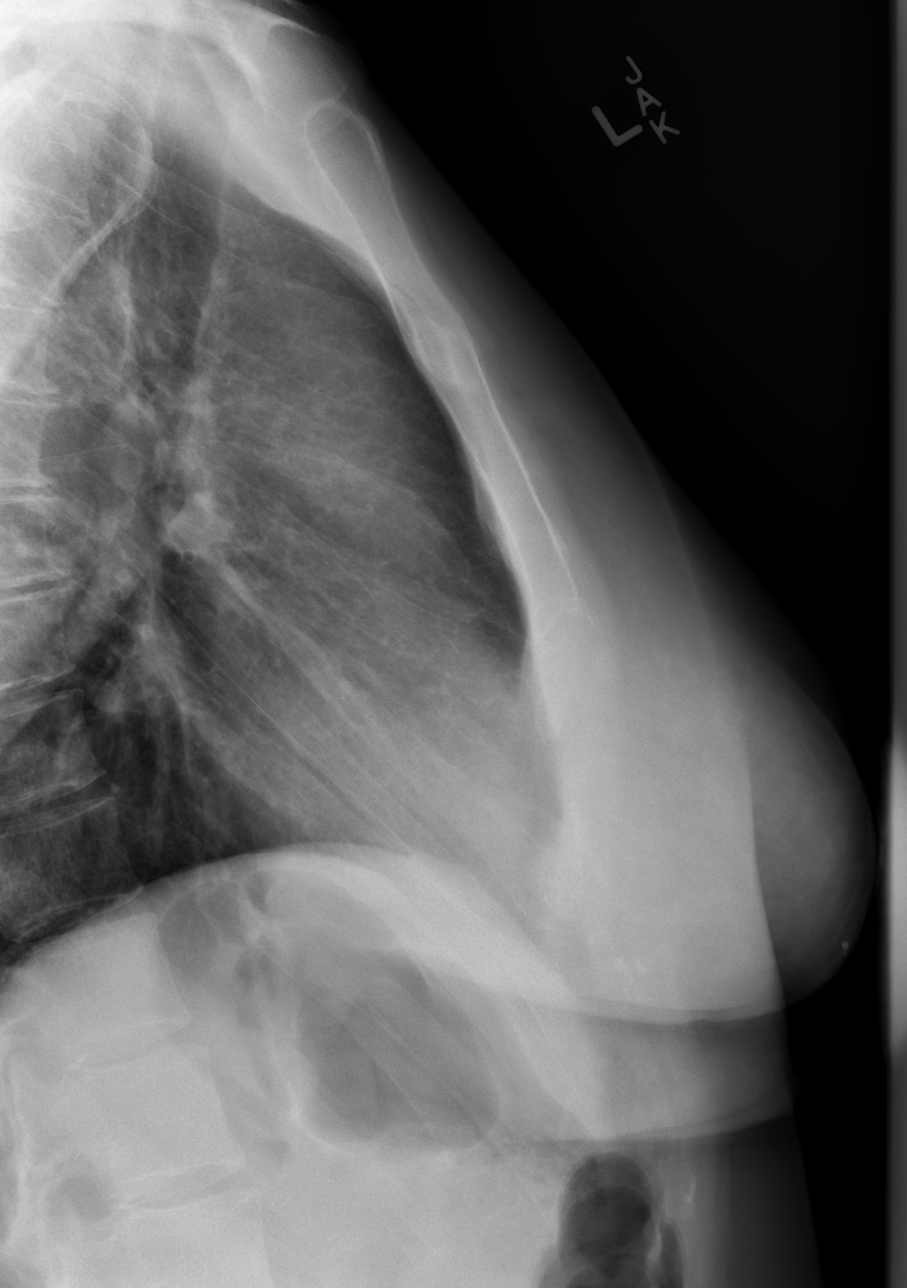

[w sternum rao]
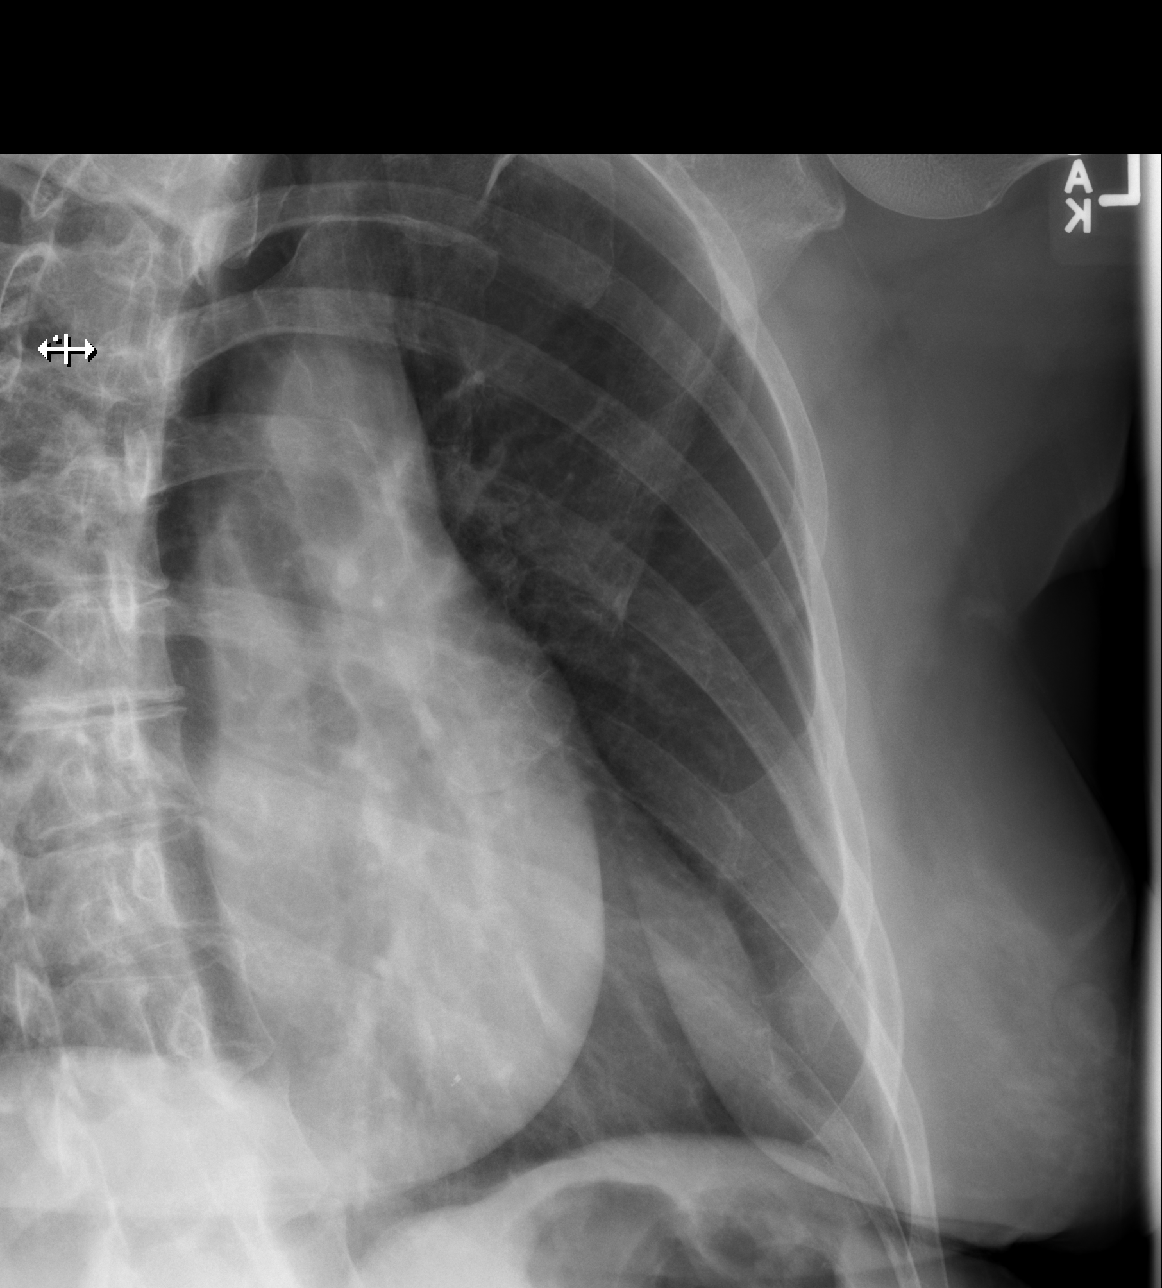

[3 of 3 positions shown; findings below may reference images not displayed]

FINDINGS: The lungs are clear. There is no pneumothorax or focal infiltrate.
No pleural effusion. The lesion identified on the patient's recent
MRI located in the superior aspect of the manubrium is not well
visualized on this study. There is no acute displaced fracture or
dislocation.
IMPRESSION: 1. The lesion visualized on the patient's prior MRI is not well
appreciated on this study. Further evaluation by CT or bone scan is
recommended.
2. No acute cardiopulmonary process.

## 2022-03-24 ENCOUNTER — Inpatient Hospital Stay: Payer: BC Managed Care – PPO | Attending: Hematology and Oncology | Admitting: Hematology and Oncology

## 2022-03-24 DIAGNOSIS — Z170 Estrogen receptor positive status [ER+]: Secondary | ICD-10-CM | POA: Insufficient documentation

## 2022-03-24 DIAGNOSIS — D0511 Intraductal carcinoma in situ of right breast: Secondary | ICD-10-CM | POA: Insufficient documentation

## 2022-03-24 DIAGNOSIS — Z79810 Long term (current) use of selective estrogen receptor modulators (SERMs): Secondary | ICD-10-CM | POA: Insufficient documentation

## 2022-03-24 NOTE — Assessment & Plan Note (Deleted)
02/14/2020:Screening mammogram detected right breast calcifications spanning 1.4cm. Biopsy showed DCIS, high grade, ER+ 95%, PR+ 30%.Tis NX stage 0  05/11/2020: Rt Mastectomy: HG DCIS spans 7 cm. Focal Pos Posterior margin. ER/ PR Pos Treatment Plan: Adjuvant Tamoxifen  Patient is experiencing severe hot flashes.  Therefore I recommended that she cut down the dosage of tamoxifen to 10 mg a day.  Breast cancer surveillance:  She will need a breast MRI annually in February.  mammogram 01/26/2022: 2 masses in the right breast similar in appearance to 2022 likely persistent fat necrosis.  38-monthultrasound follow-up was recommended.  RTC in 1 year for follow-up

## 2022-04-07 NOTE — Progress Notes (Signed)
Patient Care Team: Wayland Salinas, MD as PCP - General (Family Medicine) Nicholas Lose, MD as Consulting Physician (Hematology and Oncology) Irene Limbo, MD as Consulting Physician (Plastic Surgery) Rolm Bookbinder, MD as Consulting Physician (General Surgery)  DIAGNOSIS: No diagnosis found.  SUMMARY OF ONCOLOGIC HISTORY: Oncology History  Ductal carcinoma in situ (DCIS) of right breast  02/14/2020 Initial Diagnosis   Screening mammogram detected right breast calcifications spanning 1.4cm. Biopsy showed DCIS, high grade, ER+ 95%, PR+ 30%.   02/19/2020 Cancer Staging   Staging form: Breast, AJCC 8th Edition - Clinical stage from 02/19/2020: Stage 0 (cTis (DCIS), cN0, cM0, ER+, PR+)   02/25/2020 Genetic Testing   No pathogenic variants detected in [facility] gene panel.  The Breast/GYN Cancer Panel offered by [facility] includes sequencing and rearrangement analysis for the following 20 genes:  ATM, BARD1, BRCA1, BRCA2, BRIP1, CDH1, CHEK2, EPCAM (deletion/duplication analysis only), MLH1, MSH2, MSH6, NBN, NF1, PALB2, PMS2, PTEN, RAD51C, RAD51D, STK11 and TP53.  The report date is February 25, 2020.    05/11/2020 Surgery   Right mastectomy with reconstruction [facility]) [phone]): DCIS, high grade, involving the posterior margin, 1 right axillary lymph node negative for carcinoma.    05/2020 -  Anti-estrogen oral therapy   Tamoxifen. 10 mg for 1 month then increase to 20 mg.     CHIEF COMPLIANT: Follow-up of right breast cancer  INTERVAL HISTORY: Meredith [name] is a 51 y.o. with above-mentioned history of right breast cancer having undergone right mastectomy with reconstruction. She presents to the clinic today for follow-up.    ALLERGIES:  is allergic to demerol [meperidine] and phenobarbital.  MEDICATIONS:  Current Outpatient Medications  Medication Sig Dispense Refill   cetirizine (ZYRTEC) 10 MG tablet Take 10 mg by mouth daily as needed for allergies.       estradiol (ESTRACE) 0.1 MG/GM vaginal cream Place 1 g vaginally 2 (two) times a week.     fluconazole (DIFLUCAN) 200 MG tablet Take 1 tablet (200 mg total) by mouth daily. Take 1 tablet every 3rd day for 3 doses, then 1 tablet once a week 6 tablet 3   levothyroxine (SYNTHROID) 100 MCG tablet Take 100 mcg by mouth daily before breakfast.     nystatin-triamcinolone (MYCOLOG II) cream nystatin-triamcinolone 100,000 unit/g-0.1 % topical cream  APPLY TO AFFECTED AREA TWICE A DAY IN THE MORNING AND IN THE EVENING     tamoxifen (NOLVADEX) 10 MG tablet Take 1 tablet (10 mg total) by mouth daily. (Patient not taking: Reported on 11/16/2021) 180 tablet 1   No current facility-administered medications for this visit.    PHYSICAL EXAMINATION: ECOG PERFORMANCE STATUS: {CHL ONC ECOG PS:[id number]}  There were no vitals filed for this visit. There were no vitals filed for this visit.  BREAST:*** No palpable masses or nodules in either right or left breasts. No palpable axillary supraclavicular or infraclavicular adenopathy no breast tenderness or nipple discharge. (exam performed in the presence of a chaperone)  LABORATORY DATA:  I have reviewed the data as listed    Latest Ref Rng & Units 10/04/2021    3:25 PM 02/19/2020    8:28 AM  CMP  Glucose 70 - 99 mg/dL 100  87   BUN 6 - 20 mg/dL 12  14   Creatinine 0.44 - 1.00 mg/dL 0.59  0.83   Sodium 135 - 145 mmol/L 139  140   Potassium 3.5 - 5.1 mmol/L 3.7  4.6   Chloride 98 - 111 mmol/L 107  105   CO2 22 - 32 mmol/L 26  28   Calcium 8.9 - 10.3 mg/dL 9.1  10.5   Total Protein 6.5 - 8.1 g/dL  7.5   Total Bilirubin 0.3 - 1.2 mg/dL  0.7   Alkaline Phos 38 - 126 U/L  74   AST 15 - 41 U/L  22   ALT 0 - 44 U/L  16     Lab Results  Component Value Date   WBC 4.5 05/07/2020   HGB 13.2 05/07/2020   HCT 40.9 05/07/2020   MCV 92.3 05/07/2020   PLT 250 05/07/2020   NEUTROABS 2.2 02/19/2020    ASSESSMENT & PLAN:  No problem-specific Assessment &  Plan notes found for this encounter.    No orders of the defined types were placed in this encounter.  The patient has a good understanding of the overall plan. she agrees with it. she will call with any problems that may develop before the next visit here. Total time spent: 30 mins including face to face time and time spent for planning, charting and co-ordination of care   Suzzette Righter, [place] 04/07/22    I Gardiner Coins am scribing for Dr. Lindi Adie  ***

## 2022-04-12 ENCOUNTER — Inpatient Hospital Stay (HOSPITAL_BASED_OUTPATIENT_CLINIC_OR_DEPARTMENT_OTHER): Payer: BC Managed Care – PPO | Admitting: Hematology and Oncology

## 2022-04-12 VITALS — BP 112/83 | HR 84 | Temp 97.2°F | Resp 16 | Wt 155.0 lb

## 2022-04-12 DIAGNOSIS — Z79810 Long term (current) use of selective estrogen receptor modulators (SERMs): Secondary | ICD-10-CM | POA: Diagnosis not present

## 2022-04-12 DIAGNOSIS — Z170 Estrogen receptor positive status [ER+]: Secondary | ICD-10-CM | POA: Diagnosis not present

## 2022-04-12 DIAGNOSIS — D0511 Intraductal carcinoma in situ of right breast: Secondary | ICD-10-CM

## 2022-04-12 NOTE — Assessment & Plan Note (Addendum)
02/14/2020:Screening mammogram detected right breast calcifications spanning 1.4cm. Biopsy showed DCIS, high grade, ER+ 95%, PR+ 30%.Tis NX stage 0  05/11/2020: Rt Mastectomy: HG DCIS spans 7 cm. Focal Pos Posterior margin. ER/ PR Pos  Current treatment: Adjuvant Tamoxifen: Could not tolerate it because of severe hot flashes and frequent yeast infections which did not get better even at lower doses.  Discontinued September 2023.  We discussed the risks and benefits of anastrozole and we decided not to initiate the therapy.  Breast cancer surveillance:  1. Breast MRI 08/03/2021: Benign breast density category B 2. mammogram 01/26/2022: The 2 masses right breast similar to before representing fat necrosis.  Breast density category C   RTC in 1 year for follow-up

## 2022-05-04 ENCOUNTER — Other Ambulatory Visit: Payer: BC Managed Care – PPO

## 2022-05-19 ENCOUNTER — Ambulatory Visit
Admission: RE | Admit: 2022-05-19 | Discharge: 2022-05-19 | Disposition: A | Discharge status: Home / Self Care | Payer: BC Managed Care – PPO | Source: Ambulatory Visit | Attending: Plastic Surgery | Admitting: Plastic Surgery

## 2022-05-19 ENCOUNTER — Other Ambulatory Visit: Payer: Self-pay | Admitting: Plastic Surgery

## 2022-05-19 DIAGNOSIS — N6310 Unspecified lump in the right breast, unspecified quadrant: Secondary | ICD-10-CM

## 2022-08-05 ENCOUNTER — Encounter: Payer: Self-pay | Admitting: Hematology and Oncology

## 2022-08-05 ENCOUNTER — Other Ambulatory Visit: Payer: Self-pay | Admitting: *Deleted

## 2022-08-05 DIAGNOSIS — D0511 Intraductal carcinoma in situ of right breast: Secondary | ICD-10-CM

## 2022-08-05 NOTE — Progress Notes (Signed)
Pt requesting orders be placed at [facility] for yearly breast MRI.  Verbal orders received and placed.

## 2022-08-08 ENCOUNTER — Telehealth: Payer: Self-pay

## 2022-08-08 NOTE — Telephone Encounter (Signed)
-----   Message from [facility] sent at 08/08/2022 10:11 AM EST ----- Regarding: RE: PA breast MRI They did update the system and I was able to get it approved. Thanks ----- Message ----- From: Myrtie Hawk, RN Sent: 08/05/2022   4:29 PM EST To: Merril Abbe, LPN; Jesse Fall, RN; # Subject: RE: PA breast MRI                              Brandi, the pt sent me a mychart message and said she has always had [employer].  She sent me a copy through mychat if you can see it that way but I also printed it and asked one of the registration girls to scan it in.  Can you please recheck.  Merleen Nicely ----- Message ----- From: Elliot Gault Sent: 08/05/2022   3:26 PM EST To: Merril Abbe, LPN; Jesse Fall, RN; # Subject: RE: PA breast MRI                              I am showing the patient is self-pay. Her BCBS that is on file has terminated.  Thanks  Brandi ----- Message ----- From: Myrtie Hawk, RN Sent: 08/05/2022   2:31 PM EST To: Merril Abbe, LPN; Jesse Fall, RN; # Subject: PA breast MRI                                  Pt needing PA for breat MRI to be completed at [place] [facility].  The order had an expected date of 2/15 so we are needing this expedited.  Please let us know once PA is obtained.  Merleen Nicely

## 2022-08-08 NOTE — Telephone Encounter (Signed)
Attempted to call pt to advise MRI was approved and provide pt with number to centralized scheduling. LVM for call back to give instructions.

## 2022-08-22 ENCOUNTER — Ambulatory Visit (HOSPITAL_COMMUNITY)
Admission: RE | Admit: 2022-08-22 | Discharge: 2022-08-22 | Disposition: A | Discharge status: Home / Self Care | Payer: BC Managed Care – PPO | Source: Ambulatory Visit | Attending: Hematology and Oncology | Admitting: Hematology and Oncology

## 2022-08-22 DIAGNOSIS — D0511 Intraductal carcinoma in situ of right breast: Secondary | ICD-10-CM | POA: Insufficient documentation

## 2022-08-22 MED ORDER — GADOBUTROL 1 MMOL/ML IV SOLN
7.0000 mL | Freq: Once | INTRAVENOUS | Status: AC | PRN
  Administered 2022-08-22: 7 mL via INTRAVENOUS

## 2022-08-24 ENCOUNTER — Other Ambulatory Visit: Payer: Self-pay | Admitting: Hematology and Oncology

## 2022-08-24 DIAGNOSIS — N6310 Unspecified lump in the right breast, unspecified quadrant: Secondary | ICD-10-CM

## 2022-08-31 ENCOUNTER — Ambulatory Visit
Admission: RE | Admit: 2022-08-31 | Discharge: 2022-08-31 | Disposition: A | Discharge status: Home / Self Care | Payer: BC Managed Care – PPO | Source: Ambulatory Visit | Attending: Hematology and Oncology | Admitting: Hematology and Oncology

## 2022-08-31 DIAGNOSIS — N6310 Unspecified lump in the right breast, unspecified quadrant: Secondary | ICD-10-CM

## 2023-02-16 ENCOUNTER — Other Ambulatory Visit: Payer: BC Managed Care – PPO

## 2023-02-24 ENCOUNTER — Ambulatory Visit
Admission: RE | Admit: 2023-02-24 | Discharge: 2023-02-24 | Disposition: A | Discharge status: Home / Self Care | Payer: BC Managed Care – PPO | Source: Ambulatory Visit | Attending: Plastic Surgery

## 2023-02-24 DIAGNOSIS — N6310 Unspecified lump in the right breast, unspecified quadrant: Secondary | ICD-10-CM

## 2023-04-13 ENCOUNTER — Encounter: Payer: BC Managed Care – PPO | Admitting: Adult Health

## 2023-04-21 ENCOUNTER — Encounter: Payer: BC Managed Care – PPO | Admitting: Adult Health

## 2023-05-09 ENCOUNTER — Telehealth: Payer: Self-pay | Admitting: Adult Health

## 2023-05-09 ENCOUNTER — Inpatient Hospital Stay: Payer: BC Managed Care – PPO | Attending: Adult Health | Admitting: Adult Health

## 2023-05-09 VITALS — BP 103/74 | HR 96 | Temp 98.9°F | Resp 18 | Ht 63.0 in | Wt 167.8 lb

## 2023-05-09 DIAGNOSIS — Z13820 Encounter for screening for osteoporosis: Secondary | ICD-10-CM

## 2023-05-09 DIAGNOSIS — Z170 Estrogen receptor positive status [ER+]: Secondary | ICD-10-CM | POA: Diagnosis not present

## 2023-05-09 DIAGNOSIS — D0511 Intraductal carcinoma in situ of right breast: Secondary | ICD-10-CM

## 2023-05-09 DIAGNOSIS — Z79811 Long term (current) use of aromatase inhibitors: Secondary | ICD-10-CM | POA: Diagnosis not present

## 2023-05-09 DIAGNOSIS — Z1239 Encounter for other screening for malignant neoplasm of breast: Secondary | ICD-10-CM

## 2023-05-09 DIAGNOSIS — Z1721 Progesterone receptor positive status: Secondary | ICD-10-CM | POA: Diagnosis not present

## 2023-05-09 DIAGNOSIS — Z79810 Long term (current) use of selective estrogen receptor modulators (SERMs): Secondary | ICD-10-CM | POA: Diagnosis not present

## 2023-05-09 MED ORDER — ANASTROZOLE 1 MG PO TABS: 1.0000 mg | ORAL_TABLET | Freq: Every day | ORAL | 3 refills | Status: DC

## 2023-05-09 NOTE — Assessment & Plan Note (Signed)
Noninvasive Breast Cancer Three years post mastectomy with a history of tamoxifen use discontinued due to side effects. Discussed the risks/benefits of starting anastrozole (hot flashes, vaginal dryness, joint aches/pains, bone loss) vs. not taking any medication. -Start Anastrozole and monitor for side effects. -Order bone density testing at [facility]. -Continue intensified screening.  Lifestyle and Diet Discussed the importance of a plant-based diet, regular exercise, and maintaining a healthy weight in reducing the risk of cancer recurrence. -Encourage continuation of healthy lifestyle habits. -Follow-up in 12 weeks to assess tolerance to Anastrozole and general health status.  Breast Cancer Screening MRI scheduled for March. -Continue with scheduled MRI.  RTC in 12 weeks for f/u and in 1 year for f/u.

## 2023-05-09 NOTE — Progress Notes (Signed)
[facility] Cancer Follow up:    [name], [name], MD [address] [place] [address]   DIAGNOSIS:  Cancer Staging  Ductal carcinoma in situ (DCIS) of right breast Staging form: Breast, AJCC 8th Edition - Clinical stage from 02/19/2020: Stage 0 (cTis (DCIS), cN0, cM0, ER+, PR+) - Signed by Serena Croissant, MD on 02/19/2020 Stage prefix: Initial diagnosis Nuclear grade: G3   SUMMARY OF ONCOLOGIC HISTORY: Oncology History  Ductal carcinoma in situ (DCIS) of right breast  02/14/2020 Initial Diagnosis   Screening mammogram detected right breast calcifications spanning 1.4cm. Biopsy showed DCIS, high grade, ER+ 95%, PR+ 30%.   02/19/2020 Cancer Staging   Staging form: Breast, AJCC 8th Edition - Clinical stage from 02/19/2020: Stage 0 (cTis (DCIS), cN0, cM0, ER+, PR+)   02/25/2020 Genetic Testing   No pathogenic variants detected in [facility] gene panel.  The Breast/GYN Cancer Panel offered by [facility] includes sequencing and rearrangement analysis for the following 20 genes:  ATM, BARD1, BRCA1, BRCA2, BRIP1, CDH1, CHEK2, EPCAM (deletion/duplication analysis only), MLH1, MSH2, MSH6, NBN, NF1, PALB2, PMS2, PTEN, RAD51C, RAD51D, STK11 and TP53.  The report date is February 25, 2020.    05/11/2020 Surgery   Right mastectomy with reconstruction [facility]) [phone]): DCIS, high grade, involving the posterior margin, 1 right axillary lymph node negative for carcinoma.    05/2020 -  Anti-estrogen oral therapy   Tamoxifen. 10 mg for 1 month then increase to 20 mg.     CURRENT THERAPY: observation   INTERVAL HISTORY: Meredith [name] 52 y.o. female with a history of noninvasive breast cancer, presents for a routine follow-up. She underwent a mastectomy three years ago and has been under intensified screening since. She reports no new breast changes or concerns.  The patient has been off tamoxifen for about a year due to recurrent yeast infections,  which she attributes to the medication. She expresses concern about the possibility of recurrence, influenced by the experiences of friends with late-stage metastatic breast cancer. However, she acknowledges that her cancer was noninvasive, unlike her friends'.  Regarding lifestyle, the patient has gained some weight recently but is committed to a healthy diet and regular exercise. She expresses concern about the potential harm of processed foods and has been making efforts to consume more organic and natural foods.  The patient is considering starting anastrozole but is wary of potential side effects, including hot flashes, vaginal dryness, joint aches and pains, and bone loss. She had a hysterectomy in the past and has been through menopause. She is open to trying anastrozole and stopping it if she experiences intolerable side effects.  The patient's family, particularly their daughter, is supportive and proactive about health, with a focus on organic foods and breastfeeding for cancer prevention. The patient acknowledges the importance of lifestyle in cancer prevention and is committed to maintaining healthy habits.   Patient Active Problem List   Diagnosis Date Noted   Genetic testing 02/26/2020   Family history of prostate cancer 02/20/2020   Ductal carcinoma in situ (DCIS) of right breast 02/14/2020    is allergic to demerol [meperidine] and phenobarbital.  MEDICAL HISTORY: Past Medical History:  Diagnosis Date   Breast cancer (HCC) 2021   right breast DCIS-mastectomy   Family history of prostate cancer 02/20/2020   Hypothyroidism     SURGICAL HISTORY: Past Surgical History:  Procedure Laterality Date   AXILLARY SENTINEL NODE BIOPSY Right 05/11/2020   Procedure: AXILLARY SENTINEL LYMPH NODE BIOPSY;  Surgeon: Ovidio Kin, MD;  Location[facility] OR;  Service: General;  Laterality: Right;   BREAST RECONSTRUCTION WITH PLACEMENT OF TISSUE EXPANDER AND ALLODERM Right 05/11/2020    Procedure: BREAST RECONSTRUCTION WITH PLACEMENT OF TISSUE EXPANDER AND ALLODERM;  Surgeon: Glenna Fellows, MD;  Location: [facility] OR;  Service: Plastics;  Laterality: Right;   CESAREAN SECTION     MASTECTOMY W/ SENTINEL NODE BIOPSY Right 05/11/2020   Procedure: RIGHT MASTECTOMY;  Surgeon: Ovidio Kin, MD;  Location: [facility] OR;  Service: General;  Laterality: Right;  PEC BLOCK   MASTOPEXY Left 08/11/2020   Procedure: LEFT BREAST MASTOPEXY;  Surgeon: Glenna Fellows, MD;  Location: [facility];  Service: Plastics;  Laterality: Left;   MOHS SURGERY     basal cell   REMOVAL OF TISSUE EXPANDER AND PLACEMENT OF IMPLANT Right 08/11/2020   Procedure: REMOVAL OF RIGHT TISSUE EXPANDER AND PLACEMENT OF IMPLANT;  Surgeon: Glenna Fellows, MD;  Location: [facility];  Service: Plastics;  Laterality: Right;    SOCIAL HISTORY: Social History   Socioeconomic History   Marital status: Married    Spouse name: Not on file   Number of children: Not on file   Years of education: Not on file   Highest education level: Not on file  Occupational History   Not on file  Tobacco Use   Smoking status: Never   Smokeless tobacco: Never  Vaping Use   Vaping status: Never Used  Substance and Sexual Activity   Alcohol use: Yes    Comment: socially   Drug use: Never   Sexual activity: Yes    Birth control/protection: Post-menopausal, I.U.D.  Other Topics Concern   Not on file  Social History Narrative   Not on file   Social Determinants of Health   Financial Resource Strain: Low Risk  (01/29/2023)   Received from [facility]   Overall Financial Resource Strain (CARDIA)    Difficulty of Paying Living Expenses: Not hard at all  Food Insecurity: No Food Insecurity (01/29/2023)   Received from [facility]   Hunger Vital Sign    Worried About Running Out of Food in the Last Year: Never true    Ran Out of Food in the Last Year: Never true  Transportation Needs: No  Transportation Needs (01/29/2023)   Received from [facility] - Transportation    Lack of Transportation (Medical): No    Lack of Transportation (Non-Medical): No  Physical Activity: Insufficiently Active (01/29/2023)   Received from [facility]   Exercise Vital Sign    Days of Exercise per Week: 2 days    Minutes of Exercise per Session: 30 min  Stress: No Stress Concern Present (01/29/2023)   Received from [facility] - Occupational Stress Questionnaire    Feeling of Stress : Not at all  Social Connections: Socially Integrated (01/29/2023)   Received from [facility]   Social Network    How would you rate your social network (family, work, friends)?: Good participation with social networks  Intimate Partner Violence: Not At Risk (01/29/2023)   Received from [facility]   HITS    Over the last 12 months how often did your partner physically hurt you?: Never    Over the last 12 months how often did your partner insult you or talk down to you?: Never    Over the last 12 months how often did your partner threaten you with physical harm?: Never  Over the last 12 months how often did your partner scream or curse at you?: Never    FAMILY HISTORY: Family History  Problem Relation Age of Onset   Prostate cancer Maternal Grandfather    Skin cancer Maternal Grandfather    Cancer Maternal Aunt        dx mid 51s; GYN cancer    Review of Systems  Constitutional:  Negative for appetite change, chills, fatigue, fever and unexpected weight change.  HENT:   Negative for hearing loss, lump/mass and trouble swallowing.   Eyes:  Negative for eye problems and icterus.  Respiratory:  Negative for chest tightness, cough and shortness of breath.   Cardiovascular:  Negative for chest pain, leg swelling and palpitations.  Gastrointestinal:  Negative for abdominal distention, abdominal pain, constipation, diarrhea, nausea and vomiting.   Endocrine: Negative for hot flashes.  Genitourinary:  Negative for difficulty urinating.   Musculoskeletal:  Negative for arthralgias.  Skin:  Negative for itching and rash.  Neurological:  Negative for dizziness, extremity weakness, headaches and numbness.  Hematological:  Negative for adenopathy. Does not bruise/bleed easily.  Psychiatric/Behavioral:  Negative for depression. The patient is not nervous/anxious.       PHYSICAL EXAMINATION    Vitals:   05/09/23 1020  BP: 103/74  Pulse: 96  Resp: 18  Temp: 98.9 F (37.2 C)  SpO2: 98%    Physical Exam Constitutional:      General: She is not in acute distress.    Appearance: Normal appearance. She is not toxic-appearing.  HENT:     Head: Normocephalic and atraumatic.     Mouth/Throat:     Mouth: Mucous membranes are moist.     Pharynx: Oropharynx is clear. No oropharyngeal exudate or posterior oropharyngeal erythema.  Eyes:     General: No scleral icterus. Cardiovascular:     Rate and Rhythm: Normal rate and regular rhythm.     Pulses: Normal pulses.     Heart sounds: Normal heart sounds.  Pulmonary:     Effort: Pulmonary effort is normal.     Breath sounds: Normal breath sounds.  Chest:     Comments: Right breast s/p mastectomy and reconstruction, nos ign of local recurrence, left breast benign Abdominal:     General: Abdomen is flat. Bowel sounds are normal. There is no distension.     Palpations: Abdomen is soft.     Tenderness: There is no abdominal tenderness.  Musculoskeletal:        General: No swelling.     Cervical back: Neck supple.  Lymphadenopathy:     Cervical: No cervical adenopathy.     Upper Body:     Right upper body: No axillary adenopathy.     Left upper body: No axillary adenopathy.  Skin:    General: Skin is warm and dry.     Findings: No rash.  Neurological:     General: No focal deficit present.     Mental Status: She is alert.  Psychiatric:        Mood and Affect: Mood normal.         Behavior: Behavior normal.        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 Discussed the risks/benefits of starting anastrozole (hot flashes, vaginal dryness, joint aches/pains, bone loss) vs. not taking any medication. -Start Anastrozole and monitor for side  effects. -Order bone density testing at [facility]. -Continue intensified screening.  Lifestyle and Diet Discussed the importance of a plant-based diet, regular exercise, and maintaining a healthy weight in reducing the risk of cancer recurrence. -Encourage continuation of healthy lifestyle habits. -Follow-up in 12 weeks to assess tolerance to Anastrozole and general health status.  Breast Cancer Screening MRI scheduled for March. -Continue with scheduled MRI.  RTC in 12 weeks for f/u and in 1 year for f/u.    All questions were answered. The patient knows to call the clinic with any problems, questions or concerns. We can certainly see the patient much sooner if necessary.  Total encounter time:30 minutes*in face-to-face visit time, chart review, lab review, care coordination, order entry, and documentation of the encounter time.    Lillard Anes, NP 05/09/23 11:14 AM Medical Oncology and Hematology [facility] [address] [place], [place] [address] Tel. [phone]    Fax. [phone]  *Total Encounter Time as defined by the Centers for Medicare and Medicaid Services includes, in addition to the face-to-face time of a patient visit (documented in the note above) non-face-to-face time: obtaining and reviewing outside history, ordering and reviewing medications, tests or procedures, care coordination (communications with other health care professionals or caregivers) and documentation in the medical record.

## 2023-05-09 NOTE — Telephone Encounter (Signed)
 Left patient a vm regarding upcoming appointment

## 2023-05-23 ENCOUNTER — Ambulatory Visit (HOSPITAL_BASED_OUTPATIENT_CLINIC_OR_DEPARTMENT_OTHER)
Admission: RE | Admit: 2023-05-23 | Discharge: 2023-05-23 | Disposition: A | Discharge status: Home / Self Care | Payer: BC Managed Care – PPO | Source: Ambulatory Visit | Attending: Adult Health | Admitting: Adult Health

## 2023-05-23 ENCOUNTER — Telehealth: Payer: Self-pay

## 2023-05-23 DIAGNOSIS — Z13820 Encounter for screening for osteoporosis: Secondary | ICD-10-CM | POA: Insufficient documentation

## 2023-05-23 DIAGNOSIS — Z79811 Long term (current) use of aromatase inhibitors: Secondary | ICD-10-CM | POA: Insufficient documentation

## 2023-05-23 NOTE — Telephone Encounter (Addendum)
Called pt and Lvm for message below. Advise pt to call with any questions or concerns.----- Message from Noreene Filbert sent at 05/23/2023  2:27 PM EST ----- Patient with mild osteopenia on bone density testing.  Recommend calcium vitamin D weightbearing exercises and repeat DEXA in 2 years time ----- Message ----- From: Interface, Rad Results In Sent: 05/23/2023   1:31 PM EST To: Loa Socks, NP

## 2023-07-31 ENCOUNTER — Inpatient Hospital Stay: Payer: BC Managed Care – PPO | Admitting: Adult Health

## 2023-07-31 NOTE — Progress Notes (Deleted)
 [facility] Cancer Follow up:    [name], [name], MD [address] [place] [address]   DIAGNOSIS: Cancer Staging  Ductal carcinoma in situ (DCIS) of right breast Staging form: Breast, AJCC 8th Edition - Clinical stage from 02/19/2020: Stage 0 (cTis (DCIS), cN0, cM0, ER+, PR+) - Signed by Serena Croissant, MD on 02/19/2020 Stage prefix: Initial diagnosis Nuclear grade: G3 I connected with Meredith [name] on 07/31/23 at 10:20 AM EST by telephone and verified that I am speaking with the correct person using two identifiers.  I discussed the limitations, risks, security and privacy concerns of performing an evaluation and management service by telephone and the availability of in person appointments.  I also discussed with the patient that there may be a patient responsible charge related to this service. The patient expressed understanding and agreed to proceed.   SUMMARY OF ONCOLOGIC HISTORY: Oncology History  Ductal carcinoma in situ (DCIS) of right breast  02/14/2020 Initial Diagnosis   Screening mammogram detected right breast calcifications spanning 1.4cm. Biopsy showed DCIS, high grade, ER+ 95%, PR+ 30%.   02/19/2020 Cancer Staging   Staging form: Breast, AJCC 8th Edition - Clinical stage from 02/19/2020: Stage 0 (cTis (DCIS), cN0, cM0, ER+, PR+)   02/25/2020 Genetic Testing   No pathogenic variants detected in [facility] gene panel.  The Breast/GYN Cancer Panel offered by [facility] includes sequencing and rearrangement analysis for the following 20 genes:  ATM, BARD1, BRCA1, BRCA2, BRIP1, CDH1, CHEK2, EPCAM (deletion/duplication analysis only), MLH1, MSH2, MSH6, NBN, NF1, PALB2, PMS2, PTEN, RAD51C, RAD51D, STK11 and TP53.  The report date is February 25, 2020.    05/11/2020 Surgery   Right mastectomy with reconstruction [facility]) [phone]): DCIS, high grade, involving the posterior margin, 1 right axillary lymph node negative for carcinoma.     05/2020 -  Anti-estrogen oral therapy   Tamoxifen. 10 mg for 1 month then increase to 20 mg.     CURRENT THERAPY:  INTERVAL HISTORY:  Discussed the use of AI scribe software for clinical note transcription with the patient, who gave verbal consent to proceed.  Meredith [name] 53 y.o. female returns for    Patient Active Problem List   Diagnosis Date Noted  . Genetic testing 02/26/2020  . Family history of prostate cancer 02/20/2020  . Ductal carcinoma in situ (DCIS) of right breast 02/14/2020    is allergic to demerol [meperidine] and phenobarbital.  MEDICAL HISTORY: Past Medical History:  Diagnosis Date  . Breast cancer (HCC) 2021   right breast DCIS-mastectomy  . Family history of prostate cancer 02/20/2020  . Hypothyroidism     SURGICAL HISTORY: Past Surgical History:  Procedure Laterality Date  . AXILLARY SENTINEL NODE BIOPSY Right 05/11/2020   Procedure: AXILLARY SENTINEL LYMPH NODE BIOPSY;  Surgeon: Ovidio Kin, MD;  Location: [facility] OR;  Service: General;  Laterality: Right;  . BREAST RECONSTRUCTION WITH PLACEMENT OF TISSUE EXPANDER AND ALLODERM Right 05/11/2020   Procedure: BREAST RECONSTRUCTION WITH PLACEMENT OF TISSUE EXPANDER AND ALLODERM;  Surgeon: Glenna Fellows, MD;  Location: [facility] OR;  Service: Plastics;  Laterality: Right;  . CESAREAN SECTION    . MASTECTOMY W/ SENTINEL NODE BIOPSY Right 05/11/2020   Procedure: RIGHT MASTECTOMY;  Surgeon: Ovidio Kin, MD;  Location: [facility] OR;  Service: General;  Laterality: Right;  PEC BLOCK  . MASTOPEXY Left 08/11/2020   Procedure: LEFT BREAST MASTOPEXY;  Surgeon: Glenna Fellows, MD;  Location: [facility];  Service: Plastics;  Laterality:  Left;  . MOHS SURGERY     basal cell  . REMOVAL OF TISSUE EXPANDER AND PLACEMENT OF IMPLANT Right 08/11/2020   Procedure: REMOVAL OF RIGHT TISSUE EXPANDER AND PLACEMENT OF IMPLANT;  Surgeon: Glenna Fellows, MD;  Location: [facility];  Service: Plastics;   Laterality: Right;    SOCIAL HISTORY: Social History   Socioeconomic History  . Marital status: Married    Spouse name: Not on file  . Number of children: Not on file  . Years of education: Not on file  . Highest education level: Not on file  Occupational History  . Not on file  Tobacco Use  . Smoking status: Never  . Smokeless tobacco: Never  Vaping Use  . Vaping status: Never Used  Substance and Sexual Activity  . Alcohol use: Yes    Comment: socially  . Drug use: Never  . Sexual activity: Yes    Birth control/protection: Post-menopausal, I.U.D.  Other Topics Concern  . Not on file  Social History Narrative  . Not on file   Social Drivers of Health   Financial Resource Strain: Low Risk  (01/29/2023)   Received from [facility]   Overall Financial Resource Strain (CARDIA)   . Difficulty of Paying Living Expenses: Not hard at all  Food Insecurity: No Food Insecurity (01/29/2023)   Received from [facility]   Hunger Vital Sign   . Worried About Programme researcher, broadcasting/film/video in the Last Year: Never true   . Ran Out of Food in the Last Year: Never true  Transportation Needs: No Transportation Needs (01/29/2023)   Received from [facility] - Transportation   . Lack of Transportation (Medical): No   . Lack of Transportation (Non-Medical): No  Physical Activity: Insufficiently Active (01/29/2023)   Received from [facility]   Exercise Vital Sign   . Days of Exercise per Week: 2 days   . Minutes of Exercise per Session: 30 min  Stress: No Stress Concern Present (01/29/2023)   Received from [facility] of Occupational Health - Occupational Stress Questionnaire   . Feeling of Stress : Not at all  Social Connections: Socially Integrated (01/29/2023)   Received from [facility]   Social Network   . How would you rate your social network (family, work, friends)?: Good participation with social networks  Intimate Partner Violence: Not At Risk  (01/29/2023)   Received from [facility]   HITS   . Over the last 12 months how often did your partner physically hurt you?: Never   . Over the last 12 months how often did your partner insult you or talk down to you?: Never   . Over the last 12 months how often did your partner threaten you with physical harm?: Never   . Over the last 12 months how often did your partner scream or curse at you?: Never    FAMILY HISTORY: Family History  Problem Relation Age of Onset  . Prostate cancer Maternal Grandfather   . Skin cancer Maternal Grandfather   . Cancer Maternal Aunt        dx mid 52s; GYN cancer    Review of Systems  Constitutional:  Negative for appetite change, chills, fatigue, fever and unexpected weight change.  HENT:   Negative for hearing loss, lump/mass and trouble swallowing.   Eyes:  Negative for eye problems and icterus.  Respiratory:  Negative for chest tightness, cough and shortness of breath.  Cardiovascular:  Negative for chest pain, leg swelling and palpitations.  Gastrointestinal:  Negative for abdominal distention, abdominal pain, constipation, diarrhea, nausea and vomiting.  Endocrine: Negative for hot flashes.  Genitourinary:  Negative for difficulty urinating.   Musculoskeletal:  Negative for arthralgias.  Skin:  Negative for itching and rash.  Neurological:  Negative for dizziness, extremity weakness, headaches and numbness.  Hematological:  Negative for adenopathy. Does not bruise/bleed easily.  Psychiatric/Behavioral:  Negative for depression. The patient is not nervous/anxious.       PHYSICAL EXAMINATION    There were no vitals filed for this visit.  Physical Exam     ASSESSMENT and THERAPY PLAN:   No problem-specific Assessment & Plan notes found for this encounter.  Follow up instructions:    -Return to cancer center ***  -Mammogram due in *** -Follow up with surgery ***   The patient was provided an opportunity to ask questions and  all were answered. The patient agreed with the plan and demonstrated an understanding of the instructions.   The patient was advised to call back or seek an in-person evaluation if the symptoms worsen or if the condition fails to improve as anticipated.   I provided *** minutes of {Blank single:19197::"face-to-face video visit time","non face-to-face telephone visit time"} during this encounter, and > 50% was spent counseling as documented under my assessment & plan.  Lillard Anes, NP 07/31/23 10:31 AM Medical Oncology and Hematology [facility] [address] [place], [place] [address] Tel. [phone]    Fax. [phone]

## 2023-08-17 ENCOUNTER — Encounter: Payer: Self-pay | Admitting: Adult Health

## 2023-08-21 ENCOUNTER — Ambulatory Visit (HOSPITAL_COMMUNITY)
Admission: RE | Admit: 2023-08-21 | Discharge: 2023-08-21 | Disposition: A | Discharge status: Home / Self Care | Payer: BC Managed Care – PPO | Source: Ambulatory Visit | Attending: Adult Health | Admitting: Adult Health

## 2023-08-21 DIAGNOSIS — Z1239 Encounter for other screening for malignant neoplasm of breast: Secondary | ICD-10-CM | POA: Diagnosis present

## 2023-08-21 DIAGNOSIS — D0511 Intraductal carcinoma in situ of right breast: Secondary | ICD-10-CM | POA: Diagnosis present

## 2023-08-21 MED ORDER — GADOBUTROL 1 MMOL/ML IV SOLN
7.5000 mL | Freq: Once | INTRAVENOUS | Status: AC | PRN
  Administered 2023-08-21: 7.5 mL via INTRAVENOUS

## 2023-08-28 ENCOUNTER — Telehealth: Payer: Self-pay | Admitting: *Deleted

## 2023-08-28 NOTE — Telephone Encounter (Signed)
-----   Message from Noreene Filbert sent at 08/28/2023  1:07 PM EDT ----- MRI of the breast was negative.  Please make sure patient got these results. ----- Message ----- From: Interface, Rad Results In Sent: 08/21/2023   4:41 PM EDT To: Loa Socks, NP

## 2023-08-28 NOTE — Telephone Encounter (Signed)
Per Lindsey Causey,DNP, called pt with message below. Pt verbalized understanding 

## 2023-09-01 ENCOUNTER — Inpatient Hospital Stay: Payer: BC Managed Care – PPO | Attending: Adult Health | Admitting: Adult Health

## 2023-09-01 DIAGNOSIS — D0511 Intraductal carcinoma in situ of right breast: Secondary | ICD-10-CM

## 2023-09-01 NOTE — Progress Notes (Signed)
Called x 2, no answer, LMOM

## 2023-09-05 ENCOUNTER — Telehealth: Payer: Self-pay | Admitting: Adult Health

## 2023-09-11 ENCOUNTER — Encounter: Payer: Self-pay | Admitting: Adult Health

## 2023-09-11 ENCOUNTER — Inpatient Hospital Stay (HOSPITAL_BASED_OUTPATIENT_CLINIC_OR_DEPARTMENT_OTHER): Admitting: Adult Health

## 2023-09-11 DIAGNOSIS — D0511 Intraductal carcinoma in situ of right breast: Secondary | ICD-10-CM

## 2023-09-11 DIAGNOSIS — Z1231 Encounter for screening mammogram for malignant neoplasm of breast: Secondary | ICD-10-CM

## 2023-09-11 NOTE — Assessment & Plan Note (Signed)
 Breast cancer follow-up Breast MRI showed no malignancy. Anastrozole well-tolerated after tamoxifen discontinuation. No current breast changes. - Continue anastrozole, monitor side effects. - Report new breast or chest wall changes. - Continue with annual breast MRI, annual left breast screening mammogram, and annual clinical breast exam.  - Follow-up in November.  Osteopenia Bone density test showed mild osteopenia with T-score of -1.2 in right femur. She is on vitamin D and lifestyle modifications. - Continue vitamin D. - Encourage diet and exercise. - Bone density test in December 2026.

## 2023-09-11 NOTE — Progress Notes (Signed)
 [facility] Cancer Follow up:    [name], [name], MD [address] [place] [address]   DIAGNOSIS:  Cancer Staging  Ductal carcinoma in situ (DCIS) of right breast Staging form: Breast, AJCC 8th Edition - Clinical stage from 02/19/2020: Stage 0 (cTis (DCIS), cN0, cM0, ER+, PR+) - Signed by Serena Croissant, MD on 02/19/2020 Stage prefix: Initial diagnosis Nuclear grade: G3  I connected with Meredith [name] on 09/11/23 at  8:40 AM EDT by telephone and verified that I am speaking with the correct person using two identifiers.  I discussed the limitations, risks, security and privacy concerns of performing an evaluation and management service by telephone and the availability of in person appointments.  I also discussed with the patient that there may be a patient responsible charge related to this service. The patient expressed understanding and agreed to proceed.    SUMMARY OF ONCOLOGIC HISTORY: Oncology History  Ductal carcinoma in situ (DCIS) of right breast  02/14/2020 Initial Diagnosis   Screening mammogram detected right breast calcifications spanning 1.4cm. Biopsy showed DCIS, high grade, ER+ 95%, PR+ 30%.   02/19/2020 Cancer Staging   Staging form: Breast, AJCC 8th Edition - Clinical stage from 02/19/2020: Stage 0 (cTis (DCIS), cN0, cM0, ER+, PR+)   02/25/2020 Genetic Testing   No pathogenic variants detected in [facility] gene panel.  The Breast/GYN Cancer Panel offered by [facility] includes sequencing and rearrangement analysis for the following 20 genes:  ATM, BARD1, BRCA1, BRCA2, BRIP1, CDH1, CHEK2, EPCAM (deletion/duplication analysis only), MLH1, MSH2, MSH6, NBN, NF1, PALB2, PMS2, PTEN, RAD51C, RAD51D, STK11 and TP53.  The report date is February 25, 2020.    05/11/2020 Surgery   Right mastectomy with reconstruction [facility]) [phone]): DCIS, high grade, involving the posterior margin, 1 right axillary lymph node negative for  carcinoma.    05/2020 -  Anti-estrogen oral therapy   Tamoxifen. 10 mg for 1 month then increase to 20 mg.     CURRENT THERAPY:  INTERVAL HISTORY:  Discussed the use of AI scribe software for clinical note transcription with the patient, who gave verbal consent to proceed.  Meredith [name] 53 y.o. female with a history of breast cancer and mild osteopenia, presents for a follow-up after a recent breast MRI. The MRI, performed on August 21, 2023, showed no evidence of malignancy and breast density category B. Meredith [name]'s most recent bone density test revealed mild osteopenia with a T score of -1.2 in the right femur.  In November, Meredith [name] had stopped taking tamoxifen due to side effects and was considering anastrozole. She has since started anastrozole and has been on it for 90 days without any issues. Meredith [name] reported having loose stools about a month after starting anastrozole, but this has since resolved.  Meredith [name] is also taking steps to improve her lifestyle, including taking vitamin D and focusing on cleaner eating and exercise. She has no new breast changes or concerns.   Patient Active Problem List   Diagnosis Date Noted   Genetic testing 02/26/2020   Family history of prostate cancer 02/20/2020   Ductal carcinoma in situ (DCIS) of right breast 02/14/2020    is allergic to demerol [meperidine] and phenobarbital.  MEDICAL HISTORY: Past Medical History:  Diagnosis Date   Breast cancer (HCC) 2021   right breast DCIS-mastectomy   Family history of prostate cancer 02/20/2020   Hypothyroidism     SURGICAL HISTORY: Past Surgical History:  Procedure Laterality Date   AXILLARY SENTINEL  NODE BIOPSY Right 05/11/2020   Procedure: AXILLARY SENTINEL LYMPH NODE BIOPSY;  Surgeon: Ovidio Kin, MD;  Location: [facility] OR;  Service: General;  Laterality: Right;   BREAST RECONSTRUCTION WITH PLACEMENT OF TISSUE EXPANDER AND ALLODERM Right 05/11/2020   Procedure: BREAST RECONSTRUCTION WITH PLACEMENT OF  TISSUE EXPANDER AND ALLODERM;  Surgeon: Glenna Fellows, MD;  Location: [facility] OR;  Service: Plastics;  Laterality: Right;   CESAREAN SECTION     MASTECTOMY W/ SENTINEL NODE BIOPSY Right 05/11/2020   Procedure: RIGHT MASTECTOMY;  Surgeon: Ovidio Kin, MD;  Location: [facility] OR;  Service: General;  Laterality: Right;  PEC BLOCK   MASTOPEXY Left 08/11/2020   Procedure: LEFT BREAST MASTOPEXY;  Surgeon: Glenna Fellows, MD;  Location: [facility];  Service: Plastics;  Laterality: Left;   MOHS SURGERY     basal cell   REMOVAL OF TISSUE EXPANDER AND PLACEMENT OF IMPLANT Right 08/11/2020   Procedure: REMOVAL OF RIGHT TISSUE EXPANDER AND PLACEMENT OF IMPLANT;  Surgeon: Glenna Fellows, MD;  Location: [facility];  Service: Plastics;  Laterality: Right;    SOCIAL HISTORY: Social History   Socioeconomic History   Marital status: Married    Spouse name: Not on file   Number of children: Not on file   Years of education: Not on file   Highest education level: Not on file  Occupational History   Not on file  Tobacco Use   Smoking status: Never   Smokeless tobacco: Never  Vaping Use   Vaping status: Never Used  Substance and Sexual Activity   Alcohol use: Yes    Comment: socially   Drug use: Never   Sexual activity: Yes    Birth control/protection: Post-menopausal, I.U.D.  Other Topics Concern   Not on file  Social History Narrative   Not on file   Social Drivers of Health   Financial Resource Strain: Low Risk  (01/29/2023)   Received from [facility]   Overall Financial Resource Strain (CARDIA)    Difficulty of Paying Living Expenses: Not hard at all  Food Insecurity: No Food Insecurity (01/29/2023)   Received from [facility]   Hunger Vital Sign    Worried About Running Out of Food in the Last Year: Never true    Ran Out of Food in the Last Year: Never true  Transportation Needs: No Transportation Needs (01/29/2023)   Received from [facility] - Transportation    Lack of Transportation (Medical): No    Lack of Transportation (Non-Medical): No  Physical Activity: Insufficiently Active (01/29/2023)   Received from [facility]   Exercise Vital Sign    Days of Exercise per Week: 2 days    Minutes of Exercise per Session: 30 min  Stress: No Stress Concern Present (01/29/2023)   Received from [facility] - Occupational Stress Questionnaire    Feeling of Stress : Not at all  Social Connections: Socially Integrated (01/29/2023)   Received from [facility]   Social Network    How would you rate your social network (family, work, friends)?: Good participation with social networks  Intimate Partner Violence: Not At Risk (01/29/2023)   Received from [facility]   HITS    Over the last 12 months how often did your partner physically hurt you?: Never    Over the last 12 months how often did your partner insult you or talk down to you?: Never    Over the last  12 months how often did your partner threaten you with physical harm?: Never    Over the last 12 months how often did your partner scream or curse at you?: Never    FAMILY HISTORY: Family History  Problem Relation Age of Onset   Prostate cancer Maternal Grandfather    Skin cancer Maternal Grandfather    Cancer Maternal Aunt        dx mid 24s; GYN cancer    Review of Systems  Constitutional:  Negative for appetite change, chills, fatigue, fever and unexpected weight change.  HENT:   Negative for hearing loss, lump/mass and trouble swallowing.   Eyes:  Negative for eye problems and icterus.  Respiratory:  Negative for chest tightness, cough and shortness of breath.   Cardiovascular:  Negative for chest pain, leg swelling and palpitations.  Gastrointestinal:  Negative for abdominal distention, abdominal pain, constipation, diarrhea, nausea and vomiting.  Endocrine: Negative for hot flashes.  Genitourinary:  Negative for  difficulty urinating.   Musculoskeletal:  Negative for arthralgias.  Skin:  Negative for itching and rash.  Neurological:  Negative for dizziness, extremity weakness, headaches and numbness.  Hematological:  Negative for adenopathy. Does not bruise/bleed easily.  Psychiatric/Behavioral:  Negative for depression. The patient is not nervous/anxious.       PHYSICAL EXAMINATION Patient sounds well.  In no apparent distress.  Mood and behavior are normal.  Speech is normal.      ASSESSMENT and THERAPY PLAN:   Ductal carcinoma in situ (DCIS) of right breast Breast cancer follow-up Breast MRI showed no malignancy. Anastrozole well-tolerated after tamoxifen discontinuation. No current breast changes. - Continue anastrozole, monitor side effects. - Report new breast or chest wall changes. - Continue with annual breast MRI, annual left breast screening mammogram, and annual clinical breast exam.  - Follow-up in November.  Osteopenia Bone density test showed mild osteopenia with T-score of -1.2 in right femur. She is on vitamin D and lifestyle modifications. - Continue vitamin D. - Encourage diet and exercise. - Bone density test in December 2026.    Follow up instructions:    -Return to cancer center 04/2024 for f/u  -Mammogram due in 02/2024 -Breat MRI due 08/2024 -DEXA 05/2025   The patient was provided an opportunity to ask questions and all were answered. The patient agreed with the plan and demonstrated an understanding of the instructions.   The patient was advised to call back or seek an in-person evaluation if the symptoms worsen or if the condition fails to improve as anticipated.   I provided 10 minutes of non face-to-face telephone visit time during this encounter, and > 50% was spent counseling as documented under my assessment & plan.    Lillard Anes, NP 09/11/23 8:49 AM Medical Oncology and Hematology [facility] [address] [place], [place]  [address] Tel. [phone]    Fax. [phone]  *Total Encounter Time as defined by the Centers for Medicare and Medicaid Services includes, in addition to the face-to-face time of a patient visit (documented in the note above) non-face-to-face time: obtaining and reviewing outside history, ordering and reviewing medications, tests or procedures, care coordination (communications with other health care professionals or caregivers) and documentation in the medical record.

## 2024-01-11 ENCOUNTER — Encounter: Payer: Self-pay | Admitting: Adult Health

## 2024-02-25 NOTE — Progress Notes (Signed)
Orders placed in this encounter.

## 2024-02-27 ENCOUNTER — Ambulatory Visit
Admission: RE | Admit: 2024-02-27 | Discharge: 2024-02-27 | Disposition: A | Discharge status: Home / Self Care | Source: Ambulatory Visit | Attending: Adult Health | Admitting: Adult Health

## 2024-02-27 DIAGNOSIS — Z1231 Encounter for screening mammogram for malignant neoplasm of breast: Secondary | ICD-10-CM

## 2024-04-21 ENCOUNTER — Other Ambulatory Visit: Payer: Self-pay | Admitting: Adult Health

## 2024-04-21 DIAGNOSIS — D0511 Intraductal carcinoma in situ of right breast: Secondary | ICD-10-CM

## 2024-05-08 ENCOUNTER — Encounter: Payer: Self-pay | Admitting: Adult Health

## 2024-05-08 ENCOUNTER — Inpatient Hospital Stay: Payer: BC Managed Care – PPO | Attending: Adult Health | Admitting: Adult Health

## 2024-05-08 VITALS — BP 122/78 | HR 84 | Temp 98.0°F | Resp 18 | Ht 63.0 in | Wt 166.0 lb

## 2024-05-08 DIAGNOSIS — D0511 Intraductal carcinoma in situ of right breast: Secondary | ICD-10-CM | POA: Diagnosis present

## 2024-05-08 DIAGNOSIS — Z9189 Other specified personal risk factors, not elsewhere classified: Secondary | ICD-10-CM

## 2024-05-08 NOTE — Assessment & Plan Note (Signed)
 02/14/2020:Screening mammogram detected right breast calcifications spanning 1.4cm. Biopsy showed DCIS, high grade, ER+ 95%, PR+ 30%.  Tis NX stage 0   05/11/2020: Rt Mastectomy: HG DCIS spans 7 cm. Focal Pos Posterior margin. ER/ PR Pos  Current treatment: Adjuvant Tamoxifen : Could not tolerate it because of severe hot flashes and frequent yeast infections which did not get better even at lower doses.  Discontinued September 2023. Anastrozole  daily 04/2023  Breast cancer surveillance:  1. Breast MRI alternating with mammogram

## 2024-05-08 NOTE — Progress Notes (Signed)
 [facility] Cancer Follow up:    [name], Meredith HERO, MD [address] [place] [address]   DIAGNOSIS:  Cancer Staging  Ductal carcinoma in situ (DCIS) of right breast Staging form: Breast, AJCC 8th Edition - Clinical stage from 02/19/2020: Stage 0 (cTis (DCIS), cN0, cM0, ER+, PR+) - Signed by Odean Potts, MD on 02/19/2020 Stage prefix: Initial diagnosis Nuclear grade: G3    SUMMARY OF ONCOLOGIC HISTORY: Oncology History  Ductal carcinoma in situ (DCIS) of right breast  02/14/2020 Initial Diagnosis   Screening mammogram detected right breast calcifications spanning 1.4cm. Biopsy showed DCIS, high grade, ER+ 95%, PR+ 30%.   02/19/2020 Cancer Staging   Staging form: Breast, AJCC 8th Edition - Clinical stage from 02/19/2020: Stage 0 (cTis (DCIS), cN0, cM0, ER+, PR+)   02/25/2020 Genetic Testing   No pathogenic variants detected in [facility] gene panel.  The Breast/GYN Cancer Panel offered by [facility] includes sequencing and rearrangement analysis for the following 20 genes:  ATM, BARD1, BRCA1, BRCA2, BRIP1, CDH1, CHEK2, EPCAM (deletion/duplication analysis only), MLH1, MSH2, MSH6, NBN, NF1, PALB2, PMS2, PTEN, RAD51C, RAD51D, STK11 and TP53.  The report date is February 25, 2020.    05/11/2020 Surgery   Right mastectomy with reconstruction [facility]) [phone]): DCIS, high grade, involving the posterior margin, 1 right axillary lymph node negative for carcinoma.    05/2020 - 03/2022 Anti-estrogen oral therapy   Tamoxifen . 10 mg for 1 month then increase to 20 mg.   04/2023 -  Anti-estrogen oral therapy   Anastrozole  daily     CURRENT THERAPY: anastrozole   INTERVAL HISTORY:  Discussed the use of AI scribe software for clinical note transcription with the patient, who gave verbal consent to proceed.  History of Present Illness Meredith [name] is a 53 year old female with a history of right breast ductal carcinoma in situ who  presents for routine follow-up.  She was diagnosed with right breast ductal carcinoma in situ in September 2021 and underwent a mastectomy followed by tamoxifen  therapy. She is taking anastrozole  daily with good tolerance. Her recent left breast screening mammogram on February 27, 2024, showed no malignancy with breast density categorized as C. MRI in 08/2023 was negative for malignancy.  She has yearly checkups with her reconstruction surgeon, Dr. Arelia.  A bone density scan in December 2024 indicated mild osteopenia in the right hip with a T-score of -1.2. She maintains an active lifestyle, engaging in daily physical activity, including walking or driving for 64-59 minutes and core strengthening exercises. She is also active with her two young grandchildren.  She takes Synthroid  100 mcg daily. There are no current issues with gastroenteritis, and she has not needed Diflucan  since stopping tamoxifen .     Patient Active Problem List   Diagnosis Date Noted   Genetic testing 02/26/2020   Family history of prostate cancer 02/20/2020   Ductal carcinoma in situ (DCIS) of right breast 02/14/2020    is allergic to demerol  [meperidine ] and phenobarbital.  MEDICAL HISTORY: Past Medical History:  Diagnosis Date   Breast cancer (HCC) 2021   right breast DCIS-mastectomy   Family history of prostate cancer 02/20/2020   Hypothyroidism     SURGICAL HISTORY: Past Surgical History:  Procedure Laterality Date   AXILLARY SENTINEL NODE BIOPSY Right 05/11/2020   Procedure: AXILLARY SENTINEL LYMPH NODE BIOPSY;  Surgeon: Ethyl Lenis, MD;  Location: [facility] OR;  Service: General;  Laterality: Right;   BREAST RECONSTRUCTION WITH PLACEMENT OF TISSUE  EXPANDER AND ALLODERM Right 05/11/2020   Procedure: BREAST RECONSTRUCTION WITH PLACEMENT OF TISSUE EXPANDER AND ALLODERM;  Surgeon: Arelia Filippo, MD;  Location: [facility] OR;  Service: Plastics;  Laterality: Right;   CESAREAN SECTION     MASTECTOMY W/ SENTINEL  NODE BIOPSY Right 05/11/2020   Procedure: RIGHT MASTECTOMY;  Surgeon: Ethyl Lenis, MD;  Location: [facility] OR;  Service: General;  Laterality: Right;  PEC BLOCK   MASTOPEXY Left 08/11/2020   Procedure: LEFT BREAST MASTOPEXY;  Surgeon: Arelia Filippo, MD;  Location: [facility];  Service: Plastics;  Laterality: Left;   MOHS SURGERY     basal cell   REMOVAL OF TISSUE EXPANDER AND PLACEMENT OF IMPLANT Right 08/11/2020   Procedure: REMOVAL OF RIGHT TISSUE EXPANDER AND PLACEMENT OF IMPLANT;  Surgeon: Arelia Filippo, MD;  Location: [facility];  Service: Plastics;  Laterality: Right;    SOCIAL HISTORY: Social History   Socioeconomic History   Marital status: Married    Spouse name: Not on file   Number of children: Not on file   Years of education: Not on file   Highest education level: Not on file  Occupational History   Not on file  Tobacco Use   Smoking status: Never   Smokeless tobacco: Never  Vaping Use   Vaping status: Never Used  Substance and Sexual Activity   Alcohol use: Yes    Comment: socially   Drug use: Never   Sexual activity: Yes    Birth control/protection: Post-menopausal, I.U.D.  Other Topics Concern   Not on file  Social History Narrative   Not on file   Social Drivers of Health   Financial Resource Strain: Low Risk  (02/04/2024)   Received from [facility]   Overall Financial Resource Strain (CARDIA)    How hard is it for you to pay for the very basics like food, housing, medical care, and heating?: Not hard at all  Food Insecurity: No Food Insecurity (02/04/2024)   Received from [facility]   Hunger Vital Sign    Within the past 12 months, you worried that your food would run out before you got the money to buy more.: Never true    Within the past 12 months, the food you bought just didn't last and you didn't have money to get more.: Never true  Transportation Needs: No Transportation Needs (02/04/2024)   Received from  [facility] - Transportation    In the past 12 months, has lack of transportation kept you from medical appointments or from getting medications?: No    In the past 12 months, has lack of transportation kept you from meetings, work, or from getting things needed for daily living?: No  Physical Activity: Insufficiently Active (02/04/2024)   Received from [facility]   Exercise Vital Sign    On average, how many days per week do you engage in moderate to strenuous exercise (like a brisk walk)?: 4 days    On average, how many minutes do you engage in exercise at this level?: 30 min  Stress: No Stress Concern Present (02/04/2024)   Received from [facility] - Occupational Stress Questionnaire    Do you feel stress - tense, restless, nervous, or anxious, or unable to sleep at night because your mind is troubled all the time - these days?: Not at all  Social Connections: Socially Integrated (02/04/2024)   Received from [facility]   Social Network  How would you rate your social network (family, work, friends)?: Good participation with social networks  Intimate Partner Violence: Not At Risk (02/04/2024)   Received from [facility]   HITS    Over the last 12 months how often did your partner physically hurt you?: Never    Over the last 12 months how often did your partner insult you or talk down to you?: Never    Over the last 12 months how often did your partner threaten you with physical harm?: Never    Over the last 12 months how often did your partner scream or curse at you?: Never    FAMILY HISTORY: Family History  Problem Relation Age of Onset   Prostate cancer Maternal Grandfather    Skin cancer Maternal Grandfather    Cancer Maternal Aunt        dx mid 56s; GYN cancer    Review of Systems  Constitutional:  Negative for appetite change, chills, fatigue, fever and unexpected weight change.  HENT:   Negative for hearing  loss, lump/mass and trouble swallowing.   Eyes:  Negative for eye problems and icterus.  Respiratory:  Negative for chest tightness, cough and shortness of breath.   Cardiovascular:  Negative for chest pain, leg swelling and palpitations.  Gastrointestinal:  Negative for abdominal distention, abdominal pain, constipation, diarrhea, nausea and vomiting.  Endocrine: Negative for hot flashes.  Genitourinary:  Negative for difficulty urinating.   Musculoskeletal:  Negative for arthralgias.  Skin:  Negative for itching and rash.  Neurological:  Negative for dizziness, extremity weakness, headaches and numbness.  Hematological:  Negative for adenopathy. Does not bruise/bleed easily.  Psychiatric/Behavioral:  Negative for depression. The patient is not nervous/anxious.       PHYSICAL EXAMINATION    Vitals:   05/08/24 0918  BP: 122/78  Pulse: 84  Resp: 18  Temp: 98 F (36.7 C)  SpO2: 100%    Physical Exam Constitutional:      General: She is not in acute distress.    Appearance: Normal appearance. She is not toxic-appearing.  HENT:     Head: Normocephalic and atraumatic.     Mouth/Throat:     Mouth: Mucous membranes are moist.     Pharynx: Oropharynx is clear. No oropharyngeal exudate or posterior oropharyngeal erythema.  Eyes:     General: No scleral icterus. Cardiovascular:     Rate and Rhythm: Normal rate and regular rhythm.     Pulses: Normal pulses.     Heart sounds: Normal heart sounds.  Pulmonary:     Effort: Pulmonary effort is normal.     Breath sounds: Normal breath sounds.  Chest:     Comments: Right breast s/p mastectomy and reconstruction, no sign of local recurrence, left breast benign Abdominal:     General: Abdomen is flat. Bowel sounds are normal. There is no distension.     Palpations: Abdomen is soft.     Tenderness: There is no abdominal tenderness.  Musculoskeletal:        General: No swelling.     Cervical back: Neck supple.  Lymphadenopathy:      Cervical: No cervical adenopathy.     Upper Body:     Right upper body: No supraclavicular or axillary adenopathy.     Left upper body: No supraclavicular or axillary adenopathy.  Skin:    General: Skin is warm and dry.     Findings: No rash.  Neurological:     General: No focal deficit present.  Mental Status: She is alert.  Psychiatric:        Mood and Affect: Mood normal.        Behavior: Behavior normal.       ASSESSMENT and THERAPY PLAN:   Ductal carcinoma in situ (DCIS) of right breast 02/14/2020:Screening mammogram detected right breast calcifications spanning 1.4cm. Biopsy showed DCIS, high grade, ER+ 95%, PR+ 30%.  Tis NX stage 0   05/11/2020: Rt Mastectomy: HG DCIS spans 7 cm. Focal Pos Posterior margin. ER/ PR Pos  Current treatment: Adjuvant Tamoxifen : Could not tolerate it because of severe hot flashes and frequent yeast infections which did not get better even at lower doses.  Discontinued September 2023. Anastrozole  daily 04/2023  Breast cancer surveillance:  1. Breast MRI alternating with mammogram   Assessment and Plan Assessment & Plan History of right breast ductal carcinoma in situ, post-mastectomy, on antiestrogen therapy Right breast ductal carcinoma in situ treated with mastectomy and tamoxifen . Currently on antiestrogen therapy with no signs of breast cancer recurrence. Discussed contrast-enhanced mammograms and MRI frequency. - Continue antiestrogen therapy until December 2026. - Patient will consider contrast-enhanced mammograms and reduce MRI frequency. - Schedule MRI in March 2026 at [facility]. - Follow up with Doctor Odean 10/2024  Osteopenia of right hip Osteopenia in right hip with T-score of -1.2, stable since last evaluation. - Continue regular exercise for bone health. - Repeat DEXA in 05/2025.     All questions were answered. The patient knows to call the clinic with any problems, questions or concerns. We can certainly see the  patient much sooner if necessary.  Total encounter time: 30 minutes*in face-to-face visit time, chart review, lab review, care coordination, order entry, and documentation of the encounter time.    Morna Kendall, NP 05/08/24 9:40 AM Medical Oncology and Hematology [facility] [address] [place], [place] [address] Tel. [phone]    Fax. [phone]  *Total Encounter Time as defined by the Centers for Medicare and Medicaid Services includes, in addition to the face-to-face time of a patient visit (documented in the note above) non-face-to-face time: obtaining and reviewing outside history, ordering and reviewing medications, tests or procedures, care coordination (communications with other health care professionals or caregivers) and documentation in the medical record.

## 2024-05-13 LAB — COLOGUARD: COLOGUARD: NEGATIVE

## 2024-05-22 NOTE — Progress Notes (Signed)
 Left voice message to call back

## 2024-05-24 NOTE — Progress Notes (Signed)
 Spoke to patient about results

## 2024-11-06 ENCOUNTER — Inpatient Hospital Stay: Admitting: Hematology and Oncology

## 2025-05-09 ENCOUNTER — Inpatient Hospital Stay: Admitting: Adult Health
